# Patient Record
Sex: Male | Born: 1985 | Race: White | Hispanic: No | Marital: Single | Smoking: Current every day smoker
Health system: Southern US, Community
[De-identification: ages and names within clinical notes are randomized; demographics above are authoritative.]

## PROBLEM LIST (undated history)

## (undated) DIAGNOSIS — F909 Attention-deficit hyperactivity disorder, unspecified type: Secondary | ICD-10-CM

---
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
## 2007-07-02 ENCOUNTER — Emergency Department (HOSPITAL_COMMUNITY): Admission: EM | Admit: 2007-07-02 | Discharge: 2007-07-02 | Payer: Self-pay | Admitting: Emergency Medicine

## 2007-07-29 ENCOUNTER — Emergency Department (HOSPITAL_COMMUNITY): Admission: EM | Admit: 2007-07-29 | Discharge: 2007-07-29 | Payer: Self-pay | Admitting: Emergency Medicine

## 2007-08-05 ENCOUNTER — Emergency Department (HOSPITAL_COMMUNITY): Admission: EM | Admit: 2007-08-05 | Discharge: 2007-08-05 | Payer: Self-pay | Admitting: Emergency Medicine

## 2016-04-21 ENCOUNTER — Ambulatory Visit (HOSPITAL_COMMUNITY)
Admission: EM | Admit: 2016-04-21 | Discharge: 2016-04-21 | Disposition: A | Discharge status: Home / Self Care | Payer: BLUE CROSS/BLUE SHIELD | Attending: Emergency Medicine | Admitting: Emergency Medicine

## 2016-04-21 ENCOUNTER — Encounter (HOSPITAL_COMMUNITY): Payer: Self-pay

## 2016-04-21 DIAGNOSIS — K0889 Other specified disorders of teeth and supporting structures: Secondary | ICD-10-CM

## 2016-04-21 HISTORY — DX: Attention-deficit hyperactivity disorder, unspecified type: F90.9

## 2016-04-21 MED ORDER — HYDROCODONE-ACETAMINOPHEN 5-325 MG PO TABS: 1.0000 | ORAL_TABLET | ORAL | 0 refills | Status: DC | PRN

## 2016-04-21 NOTE — ED Triage Notes (Signed)
Pt said he is having his wisdom tooth removed in a few weeks but unable to bear the pain tonight so came to see us. No fever, ear pain or facial swelling

## 2016-04-21 NOTE — Discharge Instructions (Signed)
Must follow-up with your dentist or obtain a primary care provider for additional pain medicine. If your tooth gets worse call your dentist as soon as possible. Make sure you are taking her antibiotics.

## 2016-04-21 NOTE — ED Provider Notes (Signed)
CSN: [id number]     Arrival date & time 04/21/16  1855 History   First MD Initiated Contact with Patient 04/21/16 2023     Chief Complaint  Patient presents with  . Dental Pain   (Consider location/radiation/quality/duration/timing/severity/associated sxs/prior Treatment) 30 year old male complaining of left upper third molar pain. States he saw a dentist 3 weeks ago and told them they needed to have them extracted. States he was prescribed antibiotics for which she continues to take and pain medicine. States he has not seen anyone for his tooth or analgesics since that time. Patient states that he has an appointment on November 20 for tooth extraction. Dr. Caryn SectionMark Wilkerson  [facility] reports that he had a prescription for Percocet 5 mg #15 filled 4 days ago. The patient originally denied that then stated that he forgot when confronted with the reporting system report.      Past Medical History:  Diagnosis Date  . ADHD 1995   History reviewed. No pertinent surgical history. History reviewed. No pertinent family history. Social History  Substance Use Topics  . Smoking status: Current Every Day Smoker    Packs/day: 1.00    Years: 8.00    Types: Cigarettes  . Smokeless tobacco: Never Used  . Alcohol use No    Review of Systems  Constitutional: Negative.   HENT: Positive for dental problem. Negative for congestion.   Eyes: Negative.   Respiratory: Negative.   Neurological: Negative.   All other systems reviewed and are negative.   Allergies  Patient has no known allergies.  Home Medications   Prior to Admission medications   Medication Sig Start Date End Date Taking? Authorizing Provider  HYDROcodone-acetaminophen (NORCO/VICODIN) 5-325 MG tablet Take 1 tablet by mouth every 4 (four) hours as needed. 04/21/16   Hayden Rasmussenavid Kristiana Jacko, NP   Meds Ordered and Administered this Visit  Medications - No data to display  BP 110/69 (BP Location: Left Arm)   Pulse 62   Temp 98.3 F  (36.8 C) (Oral)   Resp 16   SpO2 99%  No data found.   Physical Exam  Constitutional: He is oriented to person, place, and time. He appears well-developed and well-nourished. No distress.  HENT:  Head: Normocephalic and atraumatic.  Mouth/Throat: Oropharynx is clear and moist.  Left second and third molars intact but with mild surrounding gingival swelling. Positive for dental tenderness. No abscess formation seen.  Neck: Normal range of motion. Neck supple.  Cardiovascular: Normal rate.   Pulmonary/Chest: Effort normal.  Neurological: He is alert and oriented to person, place, and time.  Skin: Skin is warm and dry.  Nursing note and vitals reviewed.   Urgent Care Course   Clinical Course     Procedures (including critical care time)  Labs Review Labs Reviewed - No data to display  Imaging Review No results found.   Visual Acuity Review  Right Eye Distance:   Left Eye Distance:   Bilateral Distance:    Right Eye Near:   Left Eye Near:    Bilateral Near:         MDM   1. Pain, dental    Must follow-up with your dentist or obtain a primary care provider for additional pain medicine. If your tooth gets worse call your dentist as soon as possible. Make sure you are taking her antibiotics. Meds ordered this encounter  Medications  . HYDROcodone-acetaminophen (NORCO/VICODIN) 5-325 MG tablet    Sig: Take 1 tablet by mouth every 4 (  four) hours as needed.    Dispense:  12 tablet    Refill:  0    Order Specific Question:   Supervising Provider    Answer:   Micheline ChapmanHONIG, ERIN J [[id number]]       Hayden Rasmussenavid Sausha Raymond, NP 04/21/16 2038

## 2017-02-14 ENCOUNTER — Encounter (HOSPITAL_COMMUNITY): Payer: Self-pay

## 2017-02-14 ENCOUNTER — Emergency Department (HOSPITAL_COMMUNITY): Payer: BLUE CROSS/BLUE SHIELD

## 2017-02-14 ENCOUNTER — Observation Stay (HOSPITAL_COMMUNITY)
Admission: EM | Admit: 2017-02-14 | Discharge: 2017-02-15 | DRG: 316 | Discharge status: Against Medical Advice | Payer: BLUE CROSS/BLUE SHIELD | Attending: Cardiology | Admitting: Cardiology

## 2017-02-14 DIAGNOSIS — I214 Non-ST elevation (NSTEMI) myocardial infarction: Secondary | ICD-10-CM

## 2017-02-14 DIAGNOSIS — R079 Chest pain, unspecified: Secondary | ICD-10-CM

## 2017-02-14 DIAGNOSIS — I319 Disease of pericardium, unspecified: Secondary | ICD-10-CM | POA: Diagnosis not present

## 2017-02-14 DIAGNOSIS — F17210 Nicotine dependence, cigarettes, uncomplicated: Secondary | ICD-10-CM | POA: Diagnosis present

## 2017-02-14 LAB — I-STAT TROPONIN, ED: TROPONIN I, POC: 9.43 ng/mL — AB (ref 0.00–0.08)

## 2017-02-14 LAB — BASIC METABOLIC PANEL
ANION GAP: 8 (ref 5–15)
BUN: 13 mg/dL (ref 6–20)
CALCIUM: 8.4 mg/dL — AB (ref 8.9–10.3)
CO2: 23 mmol/L (ref 22–32)
Chloride: 108 mmol/L (ref 101–111)
Creatinine, Ser: 0.87 mg/dL (ref 0.61–1.24)
GLUCOSE: 126 mg/dL — AB (ref 65–99)
POTASSIUM: 3.4 mmol/L — AB (ref 3.5–5.1)
SODIUM: 139 mmol/L (ref 135–145)

## 2017-02-14 LAB — APTT: APTT: 40 s — AB (ref 24–36)

## 2017-02-14 LAB — CBC
HEMATOCRIT: 44.3 % (ref 39.0–52.0)
HEMOGLOBIN: 15.8 g/dL (ref 13.0–17.0)
MCH: 29.8 pg (ref 26.0–34.0)
MCHC: 35.7 g/dL (ref 30.0–36.0)
MCV: 83.6 fL (ref 78.0–100.0)
Platelets: 123 10*3/uL — ABNORMAL LOW (ref 150–400)
RBC: 5.3 MIL/uL (ref 4.22–5.81)
RDW: 13 % (ref 11.5–15.5)
WBC: 10.1 10*3/uL (ref 4.0–10.5)

## 2017-02-14 LAB — PROTIME-INR
INR: 0.93
Prothrombin Time: 12.4 seconds (ref 11.4–15.2)

## 2017-02-14 LAB — TROPONIN I
TROPONIN I: 6.36 ng/mL — AB (ref <0.03)
Troponin I: 8.89 ng/mL (ref <0.03)

## 2017-02-14 LAB — HEPARIN LEVEL (UNFRACTIONATED): Heparin Unfractionated: <0.1 IU/mL — ABNORMAL LOW (ref 0.30–0.70)

## 2017-02-14 MED ORDER — COLCHICINE 0.6 MG PO TABS
0.6000 mg | ORAL_TABLET | Freq: Two times a day (BID) | ORAL | Status: DC
  Administered 2017-02-14: 0.6 mg via ORAL
  Filled 2017-02-14: qty 1

## 2017-02-14 MED ORDER — HEPARIN (PORCINE) IN NACL 100-0.45 UNIT/ML-% IJ SOLN
1400.0000 [IU]/h | INTRAMUSCULAR | Status: DC
  Administered 2017-02-14: 1400 [IU]/h via INTRAVENOUS
  Filled 2017-02-14: qty 250

## 2017-02-14 MED ORDER — HEPARIN BOLUS VIA INFUSION
4000.0000 [IU] | Freq: Once | INTRAVENOUS | Status: AC
  Administered 2017-02-14: 4000 [IU] via INTRAVENOUS
  Filled 2017-02-14: qty 4000

## 2017-02-14 MED ORDER — SODIUM CHLORIDE 0.9 % IV SOLN: 250.0000 mL | INTRAVENOUS | Status: DC | PRN

## 2017-02-14 MED ORDER — ASPIRIN 325 MG PO TABS
325.0000 mg | ORAL_TABLET | Freq: Once | ORAL | Status: AC
  Administered 2017-02-14: 325 mg via ORAL
  Filled 2017-02-14: qty 1

## 2017-02-14 MED ORDER — SODIUM CHLORIDE 0.9% FLUSH
3.0000 mL | Freq: Two times a day (BID) | INTRAVENOUS | Status: DC
  Administered 2017-02-14: 3 mL via INTRAVENOUS

## 2017-02-14 MED ORDER — SODIUM CHLORIDE 0.9% FLUSH: 3.0000 mL | INTRAVENOUS | Status: DC | PRN

## 2017-02-14 MED ORDER — IBUPROFEN 200 MG PO TABS
800.0000 mg | ORAL_TABLET | Freq: Three times a day (TID) | ORAL | Status: DC
  Administered 2017-02-14: 800 mg via ORAL
  Filled 2017-02-14: qty 4

## 2017-02-14 MED ORDER — ACETAMINOPHEN 325 MG PO TABS: 650.0000 mg | ORAL_TABLET | ORAL | Status: DC | PRN

## 2017-02-14 MED ORDER — PNEUMOCOCCAL VAC POLYVALENT 25 MCG/0.5ML IJ INJ: 0.5000 mL | INJECTION | INTRAMUSCULAR | Status: DC

## 2017-02-14 MED ORDER — ONDANSETRON HCL 4 MG/2ML IJ SOLN: 4.0000 mg | Freq: Four times a day (QID) | INTRAMUSCULAR | Status: DC | PRN

## 2017-02-14 NOTE — ED Notes (Signed)
Report given to [facility], and [facility] contacted, and stated that pt may be txp after shift change.

## 2017-02-14 NOTE — ED Provider Notes (Signed)
[facility]-EMERGENCY DEPT Provider Note   CSN: [id number] Arrival date & time: 02/14/17  1209     History   Chief Complaint Chief Complaint  Patient presents with  . Chest Pain    HPI Wayne Ballard is a 31 y.o. male.  HPI Patient presents the emergency department complaining of severe chest tightness and pressure today with associated nausea.  He had intermittent chest pain yesterday as well with some radiation towards left arm.  No prior history of cardiac disease.  He does smoke cigarettes.  No family history of early cardiac disease.  He did witness a friend become unresponsive for nights ago and the friend required CPR and mouth-to-mouth resuscitation which was performed by the patient.  The patient's friend is now recovering at an outside hospital.  The day after that event he stated he felt very tired and almost flu like.  He's never had discomfort in his chest or pain like this before.  He reports on arrival to emergency department he was having ongoing chest pressure but reports it is resolved at this point.  He was originally seen at the urgent care and was sent to the ER for further evaluation.   Past Medical History:  Diagnosis Date  . ADHD 1995    Patient Active Problem List   Diagnosis Date Noted  . NSTEMI (non-ST elevated myocardial infarction) (HCC) 02/14/2017    History reviewed. No pertinent surgical history.     Home Medications    Prior to Admission medications   Not on File    Family History No family history on file.  Social History Social History  Substance Use Topics  . Smoking status: Current Every Day Smoker    Packs/day: 1.00    Years: 8.00    Types: Cigarettes  . Smokeless tobacco: Never Used  . Alcohol use No     Allergies   Patient has no known allergies.   Review of Systems Review of Systems  All other systems reviewed and are negative.    Physical Exam Updated Vital Signs BP 117/81   Pulse 73   Temp 98.1 F (36.7 C)  (Oral)   Resp (!) 21   Ht 6' (1.829 m)   Wt 104.3 kg (230 lb)   SpO2 99%   BMI 31.19 kg/m   Physical Exam  Constitutional: He is oriented to person, place, and time. He appears well-developed and well-nourished.  HENT:  Head: Normocephalic and atraumatic.  Eyes: EOM are normal.  Neck: Normal range of motion.  Cardiovascular: Normal rate, regular rhythm, normal heart sounds and intact distal pulses.   Pulmonary/Chest: Effort normal and breath sounds normal. No respiratory distress.  Abdominal: Soft. He exhibits no distension. There is no tenderness.  Musculoskeletal: Normal range of motion.  Neurological: He is alert and oriented to person, place, and time.  Skin: Skin is warm and dry.  Psychiatric: He has a normal mood and affect. Judgment normal.  Nursing note and vitals reviewed.    ED Treatments / Results  Labs (all labs ordered are listed, but only abnormal results are displayed) Labs Reviewed  BASIC METABOLIC PANEL - Abnormal; Notable for the following:       Result Value   Potassium 3.4 (*)    Glucose, Bld 126 (*)    Calcium 8.4 (*)    All other components within normal limits  CBC - Abnormal; Notable for the following:    Platelets 123 (*)    All other components within normal limits  TROPONIN I - Abnormal; Notable for the following:    Troponin I 8.89 (*)    All other components within normal limits  RAPID URINE DRUG SCREEN, HOSP PERFORMED  APTT  PROTIME-INR  I-STAT TROPONIN, ED    EKG  EKG Interpretation  Date/Time:  Friday February 14 2017 12:25:57 EDT Ventricular Rate:  61 PR Interval:    QRS Duration: 87 QT Interval:  411 QTC Calculation: 414 R Axis:   45 Text Interpretation:  Sinus rhythm Borderline short PR interval ST elevation suggests acute pericarditis No old tracing to compare Confirmed by Azalia Bilis ([id number]) on 02/14/2017 1:38:00 PM Also confirmed by Azalia Bilis ([id number]), editor Madalyn Rob [phone])  on 02/14/2017 2:03:26 PM        Radiology Dg Chest 2 View  Result Date: 02/14/2017 CLINICAL DATA:  Chest pain, cough, and weakness for 2 days. EXAM: CHEST  2 VIEW COMPARISON:  None. FINDINGS: The heart size and mediastinal contours are within normal limits. Both lungs are clear. The visualized skeletal structures are unremarkable. IMPRESSION: Negative.  No active cardiopulmonary disease. Electronically Signed   By: Myles Rosenthal M.D.   On: 02/14/2017 13:07    ++++++++++++++++++++++++++++++++++++++++++++  Procedures .Critical Care Performed by: Azalia Bilis Authorized by: Azalia Bilis     Total critical care time: 33 minutes Critical care time was exclusive of separately billable procedures and treating other patients. Critical care was necessary to treat or prevent imminent or life-threatening deterioration. Critical care was time spent personally by me on the following activities: development of treatment plan with patient and/or surrogate as well as nursing, discussions with consultants, evaluation of patient's response to treatment, examination of patient, obtaining history from patient or surrogate, ordering and performing treatments and interventions, ordering and review of laboratory studies, ordering and review of radiographic studies, pulse oximetry and re-evaluation of patient's condition.  +++++++++++++++++++++++++++++++++++++++++++++++   Medications Ordered in ED Medications  heparin ADULT infusion 100 units/mL (25000 units/287mL sodium chloride 0.45%) (not administered)  heparin bolus via infusion 4,000 Units (not administered)  aspirin tablet 325 mg (325 mg Oral Given 02/14/17 1507)     Initial Impression / Assessment and Plan / ED Course  I have reviewed the triage vital signs and the nursing notes.  Pertinent labs & imaging results that were available during my care of the patient were reviewed by me and considered in my medical decision making (see chart for details).     Patient with  intermittent chest tightness and pressure concerning for acute coronary syndrome.  Elevated troponin here makes him non-ST elevation MI.  Patient be given aspirin and heparin at this time.  Given his recent stressful event four nights ago this may represent [place] Cardiomyopathy.  Possible represent viral myocarditis as well.  Lower suspicion for coronary occlusion however patient be started on heparin in the meantime.  Call placed to cardiology and cardiology agrees to admit the patient to the [facility] stepdown unit.  Patient is pain-free at this time    Final Clinical Impressions(s) / ED Diagnoses   Final diagnoses:  NSTEMI (non-ST elevated myocardial infarction) [facility])    New Prescriptions New Prescriptions   No medications on file     Azalia Bilis, MD 02/14/17 1538

## 2017-02-14 NOTE — ED Notes (Signed)
RN made aware of trop result.

## 2017-02-14 NOTE — ED Notes (Signed)
[facility] here to transport pt to [facility]. 

## 2017-02-14 NOTE — H&P (Addendum)
Cardiology Admission History and Physical:   Patient ID: Wayne Ballard; MRN: [id number]; DOB: 1986/05/30   Admission date: 02/14/2017  Primary Care Provider: Patient, No Pcp Per Primary Cardiologist: none Primary Electrophysiologist:  none  Chief Complaint:  Chest pain  Patient Profile:   Wayne Ballard is a 31 y.o. male with no past medical history who presented to the ER via EMS with 2 days of midsternal chest pain, found to have an elevated troponin.  History of Present Illness:   Wayne Ballard reports that his chest pain initially started on Thursday morning but resolved on its own, he went to work without recurrance.  This morning (Friday) the chest pain recurred and was much greater in severity.  It was associated with dizziness and nausea.  He was take to to the ER where his chest pain resolved spontaneously and has not resumed.    He reports that on Monday of this week, his friend OD'd on heroin and he gave him CPR including mouth to mouth.  He thinks he may have got vomit in his mouth and has had flu like symptoms all week (fatigue, chills and diarrhea).  He currently denies CP, SOB, nausea, orthopnea, or edema.   Past Medical History:  Diagnosis Date  . ADHD 1995    History reviewed. No pertinent surgical history.   Medications Prior to Admission: Prior to Admission medications   Not on File     Allergies:   No Known Allergies  Social History:   Social History   Social History  . Marital status: Single    Spouse name: N/A  . Number of children: N/A  . Years of education: N/A   Occupational History  . Not on file.   Social History Main Topics  . Smoking status: Current Every Day Smoker    Packs/day: 1.00    Years: 8.00    Types: Cigarettes  . Smokeless tobacco: Never Used  . Alcohol use Yes     Comment: Social  . Drug use: Yes    Types: Marijuana     Comment: Used heroin approx 5 months ago, distant cocaine use (years), no methamphetamine  .  Sexual activity: Not on file   Other Topics Concern  . Not on file   Social History Narrative  . No narrative on file    Family History:   No significant CAD or SCD h istory.  ROS:  Please see the history of present illness. Patient endorsed 1 week of flu like illness, diarrhea and fatigue.  All other ROS reviewed and negative.     Physical Exam/Data:   Vitals:   02/14/17 1900 02/14/17 1930 02/14/17 2015 02/14/17 2035  BP: 118/70 121/75  121/72  Pulse: (!) 59 66  71  Resp: (!) 21 12  19   Temp:    98.6 F (37 C)  TempSrc:    Oral  SpO2: 99% 99%  99%  Weight:   97 kg (213 lb 14.4 oz)   Height:   6' (1.829 m)    No intake or output data in the 24 hours ending 02/14/17 2053 Filed Weights   02/14/17 1224 02/14/17 2015  Weight: 104.3 kg (230 lb) 97 kg (213 lb 14.4 oz)   Body mass index is 29.01 kg/m.  General:  Well nourished, well developed, in no acute distress HEENT: normal Lymph: no adenopathy Neck: no JVD Endocrine:  No thryomegaly Vascular: No carotid bruits; FA pulses 2+ bilaterally without bruits  Cardiac:  normal  S1, S2; RRR; no murmur, no rub Lungs:  clear to auscultation bilaterally, no wheezing, rhonchi or rales  Abd: soft, nontender, no hepatomegaly  Ext: no edema Musculoskeletal:  No deformities, BUE and BLE strength normal and equal Skin: warm and dry  Neuro:  CNs 2-12 intact, no focal abnormalities noted Psych:  Normal affect    EKG:  The ECG that was done  was personally reviewed and demonstrates diffuse mild ST segment elevation with PR depression consistent with pericarditis  Relevant CV Studies: Bedside echo demonstrated no pericardial effusion and grossly normal LV systolic function  Laboratory Data:  Chemistry  Recent Labs Lab 02/14/17 1249  NA 139  K 3.4*  CL 108  CO2 23  GLUCOSE 126*  BUN 13  CREATININE 0.87  CALCIUM 8.4*  GFRNONAA >60  GFRAA >60  ANIONGAP 8    No results for input(s): PROT, ALBUMIN, AST, ALT, ALKPHOS,  BILITOT in the last 168 hours. Hematology  Recent Labs Lab 02/14/17 1249  WBC 10.1  RBC 5.30  HGB 15.8  HCT 44.3  MCV 83.6  MCH 29.8  MCHC 35.7  RDW 13.0  PLT 123*   Cardiac Enzymes  Recent Labs Lab 02/14/17 1310  TROPONINI 8.89*     Recent Labs Lab 02/14/17 1319  TROPIPOC 9.43*    BNPNo results for input(s): BNP, PROBNP in the last 168 hours.  DDimer No results for input(s): DDIMER in the last 168 hours.  Radiology/Studies:  Dg Chest 2 View  Result Date: 02/14/2017 CLINICAL DATA:  Chest pain, cough, and weakness for 2 days. EXAM: CHEST  2 VIEW COMPARISON:  None. FINDINGS: The heart size and mediastinal contours are within normal limits. Both lungs are clear. The visualized skeletal structures are unremarkable. IMPRESSION: Negative.  No active cardiopulmonary disease. Electronically Signed   By: Myles Rosenthal M.D.   On: 02/14/2017 13:07    Assessment and Plan:   1. Chest pain: Patient presents with 2 days of intermittent chest pain with ECG findings consistent with percarditis.  Elevated troponin indicates that there has been myocardial injury likely related to myo-pericarditis.  Patient is currently chest pain free and is low risk for ACS given young age and no prior cardiac history.  No signs of heart failure on exam and LV systolic function grossly normal on bedside echo.  Will evaluate further with formal TTE tomorrow to better assess cardiac function and trend troponins overnight.  Given recent bodily fluid exposure from friend who OD'd, will check HIV and hepatitis panel.  Will treat presumptively for pericarditis with ibuprofen and colchicine 1. TTE in am 2. Trend troponins 3. Check HIV and hepatitis panel 4. Ibuprofen 800 TID 5. Colchicine 0.6 BID  Severity of Illness: The appropriate patient status for this patient is INPATIENT. Inpatient status is judged to be reasonable and necessary in order to provide the required intensity of service to ensure the patient's  safety. The patient's presenting symptoms, physical exam findings, and initial radiographic and laboratory data in the context of their chronic comorbidities is felt to place them at high risk for further clinical deterioration. Furthermore, it is not anticipated that the patient will be medically stable for discharge from the hospital within 2 midnights of admission. The following factors support the patient status of inpatient.   " The patient's presenting symptoms include chest pain with nausea and vomittign. " The worrisome physical exam findings include none. " The initial radiographic and laboratory data are worrisome because of elevated troponin. " The chronic  co-morbidities include none.   * I certify that at the point of admission it is my clinical judgment that the patient will require inpatient hospital care spanning beyond 2 midnights from the point of admission due to high intensity of service, high risk for further deterioration and high frequency of surveillance required.*    Signed, Oliva BustardZak A Loring, MD  02/14/2017 8:53 PM   Addednum: Pt left AMA at 5:00 am before being able to be evaluated by a provider.  Told his nurse that he wanted to go smoke and left. Oliva BustardZak A Loring, MD  5:00 AM  I personally examined and interviewed patient with Dr. Kathrine CordsLoring and agree with assessment and plan as stated above. I also performed bedside echo showing normal LV function.   Arvilla MeresBensimhon, Daniel, MD  9:12 PM

## 2017-02-14 NOTE — ED Triage Notes (Signed)
Pt brought in by EMS  From [facility]. Pt is c/o mid-sternal  chest pain and anxiety that is described as pressure, tightness, with associated dizziness and nausea with x 2 episodes of emesis . Pt was given 4 mg of Zofran and was effective in nausea management.  Per EMS Pt friend passed away yesterday and pt was present /and or performing CPR and this has caused the onset of symptoms.   bp 140/75, HR 60, RR 18, O2 sat 100% RA , [facility]

## 2017-02-14 NOTE — ED Notes (Signed)
Patient transported to X-ray 

## 2017-02-14 NOTE — ED Notes (Signed)
RN made aware of trop result.  

## 2017-02-14 NOTE — Progress Notes (Signed)
ANTICOAGULATION CONSULT NOTE - Initial Consult  Pharmacy Consult for heparin Indication: ACS/STEMI  No Known Allergies  Patient Measurements: Height: 6' (182.9 cm) Weight: 230 lb (104.3 kg) IBW/kg (Calculated) : 77.6 Heparin Dosing Weight: 99.2 kg  Vital Signs: Temp: 98.1 F (36.7 C) (08/31 1224) Temp Source: Oral (08/31 1224) BP: 117/81 (08/31 1430) Pulse Rate: 73 (08/31 1430)  Labs:  Recent Labs  02/14/17 1249 02/14/17 1310  HGB 15.8  --   HCT 44.3  --   PLT 123*  --   CREATININE 0.87  --   TROPONINI  --  8.89*    Estimated Creatinine Clearance: 156.5 mL/min (by C-G formula based on SCr of 0.87 mg/dL).   Medical History: Past Medical History:  Diagnosis Date  . ADHD 1995    Medications:  No meds PTA  Assessment: 31 yo M with CP.  Pharmacy consulted to dose heparin for ACS/STEMI.  Wt 104.3 kg, HDW 99.2 kg, Cr WNL, Hg 15.8, platelet count is low at 129.  Troponin + at 8.89.   Goal of Therapy:  Heparin level 0.3-0.7 units/ml Monitor platelets by anticoagulation protocol: Yes   Plan:  Give 4000 units bolus x 1 Start heparin infusion at 1400 units/hr Check anti-Xa level in 6 hours and daily while on heparin Continue to monitor H&H and platelets   Herby AbrahamMichelle T. Vikram Tillett, Pharm.D. [phone] 02/14/2017 3:22 PM

## 2017-02-15 ENCOUNTER — Other Ambulatory Visit (HOSPITAL_COMMUNITY): Payer: BLUE CROSS/BLUE SHIELD

## 2017-02-15 LAB — LIPID PANEL
Cholesterol: 119 mg/dL (ref 0–200)
HDL: 26 mg/dL — ABNORMAL LOW (ref >40)
LDL Cholesterol: 74 mg/dL (ref 0–99)
Total CHOL/HDL Ratio: 4.6 RATIO
Triglycerides: 95 mg/dL (ref <150)
VLDL: 19 mg/dL (ref 0–40)

## 2017-02-15 LAB — HIV ANTIBODY (ROUTINE TESTING W REFLEX): HIV Screen 4th Generation wRfx: NONREACTIVE

## 2017-02-15 LAB — MRSA PCR SCREENING: MRSA BY PCR: NEGATIVE

## 2017-02-15 LAB — TROPONIN I: TROPONIN I: 5.9 ng/mL — AB (ref <0.03)

## 2017-02-15 NOTE — Progress Notes (Signed)
Patient just left AMA. Patient stated he could not stay in the hospital until the morning and that he will come back later today. Patient declined to speak with the cardiologist before leaving. Leaving hospital AMA form signed and in chart. MD notified.

## 2017-02-17 LAB — HEPATITIS B SURFACE ANTIGEN: Hepatitis B Surface Ag: NEGATIVE

## 2017-02-19 ENCOUNTER — Ambulatory Visit (INDEPENDENT_AMBULATORY_CARE_PROVIDER_SITE_OTHER): Payer: BLUE CROSS/BLUE SHIELD | Admitting: Urgent Care

## 2017-02-19 ENCOUNTER — Encounter: Payer: Self-pay | Admitting: Urgent Care

## 2017-02-19 VITALS — BP 126/75 | HR 76 | Temp 98.2°F | Resp 18 | Ht 71.5 in | Wt 216.4 lb

## 2017-02-19 DIAGNOSIS — R0982 Postnasal drip: Secondary | ICD-10-CM

## 2017-02-19 DIAGNOSIS — R05 Cough: Secondary | ICD-10-CM

## 2017-02-19 DIAGNOSIS — I319 Disease of pericardium, unspecified: Secondary | ICD-10-CM

## 2017-02-19 DIAGNOSIS — R059 Cough, unspecified: Secondary | ICD-10-CM

## 2017-02-19 DIAGNOSIS — R0789 Other chest pain: Secondary | ICD-10-CM | POA: Diagnosis not present

## 2017-02-19 DIAGNOSIS — F17200 Nicotine dependence, unspecified, uncomplicated: Secondary | ICD-10-CM | POA: Diagnosis not present

## 2017-02-19 DIAGNOSIS — J029 Acute pharyngitis, unspecified: Secondary | ICD-10-CM

## 2017-02-19 MED ORDER — CETIRIZINE HCL 10 MG PO TABS: 10.0000 mg | ORAL_TABLET | Freq: Every day | ORAL | 11 refills | Status: DC

## 2017-02-19 MED ORDER — COLCHICINE 0.6 MG PO TABS: 0.6000 mg | ORAL_TABLET | Freq: Two times a day (BID) | ORAL | 2 refills | Status: DC

## 2017-02-19 MED ORDER — OMEPRAZOLE 20 MG PO CPDR: 20.0000 mg | DELAYED_RELEASE_CAPSULE | Freq: Every day | ORAL | 2 refills | Status: DC

## 2017-02-19 MED ORDER — BENZONATATE 100 MG PO CAPS: 100.0000 mg | ORAL_CAPSULE | Freq: Three times a day (TID) | ORAL | 0 refills | Status: DC | PRN

## 2017-02-19 NOTE — Patient Instructions (Addendum)
Pericarditis Pericarditis is swelling and irritation (inflammation) of your pericardium. The pericardium is a thin, double-layered, fluid-filled sac that surrounds your heart. The pericardium protects and holds your heart in your chest cavity. Inflammation of your pericardium can cause rubbing (friction) between the two layers when your heart beats. Fluid may build up between the layers of the sac (pericardial effusion). Different types of pericarditis include:  Acute pericarditis. Inflammation develops suddenly and causes pericardial effusion.  Chronic pericarditis. Inflammation may develop gradually, or it may continue after acute pericarditis and last longer than 6 months.  Constrictive pericarditis. The layers of the pericardium stiffen and develop scar tissue. The scar tissue thickens and sticks together. This makes it difficult for the heart to pump and to work as it normally does. This type is rare.  In most cases, pericarditis is acute and not serious. Chronic pericarditis and constrictive pericarditis may be more serious and may require treatment. What are the causes? Often, the cause of pericarditis is not known.If a cause is found, the cause may be:  A viral infection.  A heart attack (myocardial infarction).  Open-heart surgery (coronary artery bypass graft surgery).  Chest injury.  Autoimmune conditions, such as lupus or rheumatoid arthritis.  Kidney failure.  Low-functioning thyroid gland (hypothyroidism).  Cancer from another part of the body that has spread (metastasized) to the pericardium.  Radiation treatment.  Certain medicines, including some seizure medicines, blood thinners, heart medicines, and antibiotics.  A bacterial or fungal infection. This cause is less common.  What increases the risk? The following factors may increase your risk of pericarditis:  Being male.  Being 20-50 years old.  Having had pericarditis before.  Having had a recent  upper respiratory tract infection.  What are the signs or symptoms? The most common symptom of pericarditis is chest pain. This pain may:  Be in the center of your chest or the left side of your chest.  Not go away with rest.  Last for many hours or days.  Worsen when you lie down and go away when you sit up and lean forward.  Worsen when you swallow.  Move to your back, neck, or shoulder.  Other symptoms may include:  A chronic, dry cough.  Heart palpitations. These may feel like rapid, fluttering, or pounding heartbeats.  Dizziness or fainting.  Tiredness or fatigue.  Fever.  Rapid breathing.  Shortness of breath when lying down.  How is this diagnosed? This condition is diagnosed with a medical history, physical exam, and diagnostic tests. During your physical exam, your health care provider will listen for friction while your heart beats (pericardial rub). You may also have tests, including:  Blood work to look for signs of infection and inflammation.  Electrocardiogram (ECG).  Echocardiogram.  CT scan.  MRI.  Culture of pericardial fluid.  A tissue sample (biopsy) of the pericardium.  If tests show that you may have constrictive pericarditis, you may have a procedure (cardiac catheterization) to confirm this diagnosis. How is this treated? Treatment for this condition depends on the cause and type of pericarditis. In most cases, acute pericarditis will clear up on its own within 10 days. Treatment for other types of pericarditis may include:  Medicines, such as: ? NSAIDs for pain and inflammation. ? Steroids to reduce inflammation. ? Colchicine to relieve pain and inflammation.  A procedure to remove fluid using a needle (pericardiocentesis) if pericardial effusion puts pressure on the heart.  Surgery to remove part of the pericardium if constrictive pericarditis   develops.  If another condition is causing your pericarditis, you may need treatment  for that underlying condition. Follow these instructions at home:  Do not use tobacco products, including cigarettes, chewing tobacco, or e-cigarettes. If you need help quitting, ask your health care provider.  Maintain a healthy weight.  Follow an exercise program as told by your health care provider. You may need to limit your exercise until your symptoms go away.  Eat a heart-healthy diet. A registered dietitian can help you to learn about healthy food choices.  Take over-the-counter and prescription medicines only as told by your health care provider. Keep a list of all of your medicines with you at all times. For each medicine, include information about the name, the dosage, how often you take it, and how you take it.  Keep all follow-up visits as told by your health care provider. This is important. Contact a health care provider if:  You continue to have symptoms of pericarditis.  You develop new symptoms of pericarditis.  Your symptoms get worse. Get help right away if:  You have worsening chest pain and difficulty breathing. These symptoms may represent a serious problem that is an emergency. Do not wait to see if the symptoms will go away. Get medical help right away. Call your local emergency services (911 in the [place] Do not drive yourself to the hospital. This information is not intended to replace advice given to you by your health care provider. Make sure you discuss any questions you have with your health care provider. Document Released: 11/27/2000 Document Revised: 11/06/2015 Document Reviewed: 12/14/2014 Elsevier Interactive Patient Education  2018 [employer].    Colchicine tablets or capsules What is this medicine? COLCHICINE (KOL chi seen) is for joint pain and swelling due to attacks of acute gouty arthritis. The medicine is also used to treat familial Mediterranean fever. This medicine may be used for other purposes; ask your health care provider or  pharmacist if you have questions. COMMON BRAND NAME(S): Colcrys, MITIGARE What should I tell my health care provider before I take this medicine? They need to know if you have any of these conditions: -anemia -blood disorders like leukemia or lymphoma -heart disease -immune system problems -intestinal disease -kidney disease -liver disease -muscle pain or weakness -take other medicines -stomach problems -an unusual or allergic reaction to colchicine, other medicines, lactose, foods, dyes, or preservatives -pregnant or trying to get pregnant -breast-feeding How should I use this medicine? Take this medicine by mouth with a full glass of water. Follow the directions on the prescription label. You can take it with or without food. If it upsets your stomach, take it with food. Take your medicine at regular intervals. Do not take your medicine more often than directed. A special MedGuide will be given to you by the pharmacist with each prescription and refill. Be sure to read this information carefully each time. Talk to your pediatrician regarding the use of this medicine in children. While this drug may be prescribed for children as young as 54 years old for selected conditions, precautions do apply. Patients over 79 years old may have a stronger reaction and need a smaller dose. Overdosage: If you think you have taken too much of this medicine contact a poison control center or emergency room at once. NOTE: This medicine is only for you. Do not share this medicine with others. What if I miss a dose? If you miss a dose, take it as soon as you can. If  it is almost time for your next dose, take only that dose. Do not take double or extra doses. What may interact with this medicine? Do not take this medicine with any of the following medications: -certain medicines for fungal infections like itraconazole This medicine may also interact with the following medications: -alcohol -certain  medicines for cholesterol like atorvastatin -certain medicines for coughs and colds -certain medicines to help you breathe better -cyclosporine -digoxin -epinephrine -grapefruit or grapefruit juice -methenamine -other medicines for fungal infection -sodium bicarbonate -some antibiotics like clarithromycin, erythromycin, and telithromycin -some medicines for an irregular heartbeat or other heart problems -some medicines for cancer, like lapatinib and tamoxifen -some medicines for HIV This list may not describe all possible interactions. Give your health care provider a list of all the medicines, herbs, non-prescription drugs, or dietary supplements you use. Also tell them if you smoke, drink alcohol, or use illegal drugs. Some items may interact with your medicine. What should I watch for while using this medicine? Visit your doctor or health care professional for regular checks on your progress. You may need periodic blood checks. Alcohol can increase the chance of getting stomach problems and gout attacks. Do not drink alcohol. What side effects may I notice from receiving this medicine? Side effects that you should report to your doctor or health care professional as soon as possible: -allergic reactions like skin rash, itching or hives, swelling of the face, lips, or tongue -fever, chills, or sore throat -muscle tenderness, pain, or weakness -numbness or tingling in hands or feet -unusual bleeding or bruising -unusually weak or tired -vomiting Side effects that usually do not require medical attention (report to your doctor or health care professional if they continue or are bothersome): -diarrhea -hair loss -loss of appetite -stomach pain or nausea This list may not describe all possible side effects. Call your doctor for medical advice about side effects. You may report side effects to FDA at 1-800-FDA-1088. Where should I keep my medicine? Keep out of the reach of  children. Store at room temperature between 15 and 30 degrees C (59 and 86 degrees F). Keep container tightly closed. Protect from light. Throw away any unused medicine after the expiration date. NOTE: This sheet is a summary. It may not cover all possible information. If you have questions about this medicine, talk to your doctor, pharmacist, or health care provider.  2018 Elsevier/Gold Standard (2012-11-30 16:48:38)     IF you received an x-ray today, you will receive an invoice from [facility]. Please contact [facility] at [phone] with questions or concerns regarding your invoice.   IF you received labwork today, you will receive an invoice from [facility]. Please contact [facility] at [phone] with questions or concerns regarding your invoice.   Our billing staff will not be able to assist you with questions regarding bills from these companies.  You will be contacted with the lab results as soon as they are available. The fastest way to get your results is to activate your My Chart account. Instructions are located on the last page of this paperwork. If you have not heard from us regarding the results in 2 weeks, please contact this office.

## 2017-02-19 NOTE — Progress Notes (Signed)
   MRN: [id number] DOB: 1985/08/05  Subjective:   Wayne LampMichael S Ballard is a 31 y.o. male presenting for hospital follow up regarding pericarditis. Patient was seen 02/14/2017. ECG demonstrated diffuse mild ST elevation consistent with pericarditis, was started on ibuprofen and colchicine. Patient also had elevated troponin indicating myocardial injury likely related to myo-pericarditis as per Dr. Kathrine CordsLoring. Bedside echocardiogram did not show pericardial effusion and demonstrated grossly normal LV systolic function. Unfortunately, patient left AMA. Today, patient reports that his chest pain has resolved since being in the hospital. Denies fever, shob, heart racing, n/v, abdominal pain, rashes. He has had a sore throat, sinus congestion, mildly productive cough for ~1 week. He would like to get a work note for 02/14/2017, the day he had to be hospitalized for his pericarditis.  Wayne Ballard is not currently taking any medications. Also has No Known Allergies.  Wayne Ballard  has a past medical history of ADHD (1995). Also denies past surgical history.  Objective:   Vitals: BP 126/75   Pulse 76   Temp 98.2 F (36.8 C) (Oral)   Resp 18   Ht 5' 11.5" (1.816 m)   Wt 216 lb 6.4 oz (98.2 kg)   SpO2 98%   BMI 29.76 kg/m   Physical Exam  Constitutional: He is oriented to person, place, and time. He appears well-developed and well-nourished.  Eyes: No scleral icterus.  Neck: Normal range of motion. Neck supple.  Cardiovascular: Normal rate, regular rhythm and intact distal pulses.  Exam reveals no gallop and no friction rub.   No murmur heard. Pulmonary/Chest: No respiratory distress. He has no wheezes. He has no rales.  Abdominal: Soft. Bowel sounds are normal. He exhibits no distension and no mass. There is no tenderness. There is no guarding.  Lymphadenopathy:    He has no cervical adenopathy.  Neurological: He is alert and oriented to person, place, and time.  Skin: Skin is warm and dry.   ECG  interpretation - Non-specific t-wave flattening in Leads III, V6 but NO diffuse ST-elevation. Normal sinus rhythm at 60bpm.  Assessment and Plan :   This case was precepted with Dr. Katrinka BlazingSmith.   1. Myopericarditis 2. Other chest pain - Stable, discussed case with Dr. Rosemary HolmsPatwardhan. They will work patient into [facility] for follow up and management of pericarditis. In the meantime, patient agreed to start colchicine BID. He will also use Prilosec with colchicine. - Ambulatory referral to Cardiology  3. Sore throat 4. Post-nasal drainage 5. Cough - May be allergy related or viral illness. Offered supportive care. Return-to-clinic precautions discussed, patient verbalized understanding.  - benzonatate (TESSALON) 100 MG capsule; Take 1-2 capsules (100-200 mg total) by mouth 3 (three) times daily as needed.  Dispense: 60 capsule; Refill: 0  6. Tobacco use disorder - Encouraged smoking cessation, patient plans on cutting back his smoking.  Wallis BambergMario Nashonda Limberg, PA-C [facility] [phone] 02/19/2017 10:54 AM

## 2017-02-20 LAB — ANTISTREPTOLYSIN O TITER: ASO: 98 [IU]/mL (ref 0.0–200.0)

## 2017-02-20 LAB — BASIC METABOLIC PANEL
BUN/Creatinine Ratio: 15 (ref 9–20)
BUN: 12 mg/dL (ref 6–20)
CALCIUM: 9.4 mg/dL (ref 8.7–10.2)
CO2: 26 mmol/L (ref 20–29)
CREATININE: 0.78 mg/dL (ref 0.76–1.27)
Chloride: 99 mmol/L (ref 96–106)
GFR calc Af Amer: 141 mL/min/{1.73_m2} (ref >59)
GFR calc non Af Amer: 122 mL/min/{1.73_m2} (ref >59)
Glucose: 82 mg/dL (ref 65–99)
POTASSIUM: 3.9 mmol/L (ref 3.5–5.2)
SODIUM: 141 mmol/L (ref 134–144)

## 2017-02-20 LAB — TROPONIN I: TROPONIN I: 0.04 ng/mL (ref 0.00–0.04)

## 2017-03-10 NOTE — H&P (Signed)
OFFICE VISIT NOTES COPIED TO EPIC FOR DOCUMENTATION  . History of Present Illness (Jagadeesh R.  MD; 02/21/2017 1:01 PM) Patient words: NP consult EVAL for myopericarditis, cp.  The patient is a 31 year old male who presents with chest pain. Patient here in a consultation visit for chest pain referred to me by Mario Mani, PA-C. Patient with history of ADHD, does admit to using cocaine occasionally, last use was in June 2018, history of occasional marijuana use, drinks alcohol occasionally, smokes about one pack of cigarettes a day who was evaluated in the emergency department when he presented with chest pain on 91 and 18. Initially presented to the urgent care and due to abnormal EKG was sent to the emergency room. His troponins were markedly elevated, felt that the EKG abnormalities may be related to acute myopericarditis. He was recommended observation and inpatient echocardiogram but patient walked out AGAINST MEDICAL ADVICE.  Patient describes chest pain as tightness in the middle of the chest, associated with marked diaphoresis and nausea but no vomiting. This occurred one day prior to admission in the morning when he woke up. Again next day morning on the day of admission he had much severe pain that lasted for an hour, eventually presented to the urgent care and then made to go to the emergency room.  He is now referred to us for further cardiovascular evaluation. He has not had any further chest pain. Denies any shortness of breath. No leg edema or hemoptysis. Otherwise feels well.   Problem List/Past Medical (April Harrington; 02/21/2017 8:28 AM) Laboratory examination (Z01.89)   Allergies (Jennifer Sergeant; 02/21/2017 9:11 AM) No Known Drug Allergies [02/21/2017]:  Family History (Jennifer Sergeant; 02/21/2017 9:11 AM) Mother  Living, no heart issues Father  Living, no heart issues  Social History (Jennifer Sergeant; 02/21/2017 9:12 AM) Current tobacco use  Current every  day smoker. 1ppd for 10 years Alcohol Use  Occasional alcohol use. Marital status  Single. Living Situation  Lives alone. Number of Children  0.  Past Surgical History (Jennifer Sergeant; 02/21/2017 9:12 AM) None [02/21/2017]:  Medication History (Jennifer Sergeant; 02/21/2017 9:16 AM) Cetirizine HCl (10MG Tablet, 1 Oral daily) Active. Omeprazole (20MG Capsule DR, 1 Oral daily) Active. Colchicine (0.6MG Tablet, 1 Oral two times daily) Active. Benzonatate (100MG Capsule, 1 Oral as needed) Active. Medications Reconciled (meds present)  Diagnostic Studies History (Jennifer Sergeant; 02/21/2017 9:13 AM) Chest X-ray [02/14/2017]: Normal.    Review of Systems (Jagadeesh R.  MD; 02/21/2017 12:55 PM) General Not Present- Appetite Loss and Weight Gain. Respiratory Not Present- Chronic Cough and Wakes up from Sleep Wheezing or Short of Breath. Gastrointestinal Not Present- Black, Tarry Stool and Difficulty Swallowing. Musculoskeletal Not Present- Decreased Range of Motion and Muscle Atrophy. Neurological Not Present- Attention Deficit. Psychiatric Not Present- Personality Changes and Suicidal Ideation. Endocrine Not Present- Cold Intolerance and Heat Intolerance. Hematology Not Present- Abnormal Bleeding. All other systems negative  Vitals (Jennifer Sergeant; 02/21/2017 9:22 AM) 02/21/2017 9:03 AM Weight: 218.19 lb Height: 72in Body Surface Area: 2.21 m Body Mass Index: 29.59 kg/m  Pulse: 80 (Regular)  P.OX: 98% (Room air) BP: 125/71 (Sitting, Left Arm, Standard)       Physical Exam (Jagadeesh R.  MD; 02/21/2017 9:52 AM) General Mental Status-Alert. General Appearance-Cooperative and Appears stated age. Build & Nutrition-Well built and Well nourished(overweight).  Head and Neck Thyroid Gland Characteristics - normal size and consistency and no palpable nodules.  Chest and Lung Exam Chest and lung exam reveals -quiet, even and easy   respiratory  effort with no use of accessory muscles, non-tender and on auscultation, normal breath sounds, no adventitious sounds.  Cardiovascular Cardiovascular examination reveals -normal heart sounds, regular rate and rhythm with no murmurs, carotid auscultation reveals no bruits, abdominal aorta auscultation reveals no bruits and no prominent pulsation, femoral artery auscultation bilaterally reveals normal pulses, no bruits, no thrills, normal pedal pulses bilaterally and no digital clubbing, cyanosis, edema, increased warmth or tenderness.  Abdomen Palpation/Percussion Palpation and Percussion of the abdomen reveal - Non Tender and No hepatosplenomegaly.  Neurologic Neurologic evaluation reveals -alert and oriented x 3 with no impairment of recent or remote memory. Motor-Grossly intact without any focal deficits.  Musculoskeletal Global Assessment Left Lower Extremity - no deformities, masses or tenderness, no known fractures. Right Lower Extremity - no deformities, masses or tenderness, no known fractures.  Assessment & Plan Laverda Page MD; 02/21/2017 12:59 PM) NSTEMI (non-ST elevated myocardial infarction) (I21.4) Story: EKG 02/21/2017: Normal sinus rhythm at rate of 74 bpm, normal axis, minimal ST elevation in inferior and lateral leads with T-wave inversion, convex ST segment suggests acute inferior lateral infarct.  EKG 02/14/2017: Normal sinus rhythm at the rate of 61 bpm, ST elevation in inferior and lateral lead. No reciprocal ST depression. Consider acute injury pattern, consider acute pericarditis. Current Plans Complete electrocardiogram (93000) Started Aspirin 81MG, 1 (one) Tablet daily, #30, 30 days starting 02/21/2017, Ref. x11, Mail Order #90, 90 days, Ref. x3. Abnormal EKG (R94.31) Future Plans Echocardiogram 03/04/2017: Left ventricle cavity is normal in size. Mild concentric hypertrophy of the left ventricle. Normal global wall motion. Normal diastolic filling  pattern. Calculated EF 68%. Structurally normal tricuspid valve with mild regurgitation. No evidence of pulmonary hypertension.  3/64/6803: METABOLIC PANEL, BASIC (21224) - one time 03/03/2017: CBC & PLATELETS (AUTO) (82500) - one time 03/03/2017: PT (PROTHROMBIN TIME) (37048) - one time Laboratory examination (Z01.89) Story: Labs 02/19/2017: Serum troponin normal, BMP normal, ESR titer negative,  Labs 02/19/2017: Serum troponin normal, BMP normal, ESR titer negative,  02/15/2017: T. Chol 119, triglycerides 95, HDL 26, LDL 74. Serum troponin 5.90. Highest serum troponin 8.89. CBC normal except Plt 123. HIV antibody negative. Hepatitis B surface antigen negative. Tobacco use disorder, continuous (F17.209) Story: 1ppd since age 23 years. Also has H/O Cocaine use last use May 2018 Low HDL (under 40) (E78.6) Current Plans Started Rosuvastatin Calcium 5MG, 1 (one) Tablet daily, #30, 02/21/2017, Ref. x1. Note:- Recommendations:  Patient's story of chest tightness associated with marked diaphoresis that lasted an hour one day prior to admission and again similar episode on the day of admission to the emergency room along with marked elevation serum troponin which completely normalized within 4 days of hospital evaluation suggest ACS although he is only 31 years of age. Also his EKG reveals convex ST segment elevation associated with T wave inversion do suggest ACS than pericarditis.  His cardiac risk factors include history of cocaine use, although last used in June 2018, one pack of cigarettes per day tobacco use, dyslipidemia with markedly reduced HDL. I have recommended an echocardiogram to evaluate structural abnormality, more important the he does need further coronary workup including coronary angiography versus CT angiogram of the chest. G1 clear-cut non-STEMI, with a high suspicion with abnormal EKG, I have recommended coronary angiography. Schedule for cardiac catheterization, and possible  angioplasty. We discussed regarding risks, benefits, alternatives to this including stress testing, CTA and continued medical therapy. Patient wants to proceed. Understands <1-2% risk of death, stroke, MI, urgent CABG, bleeding, infection,  renal failure but not limited to these.  I have started him on aspirin 81 mg daily, also Crestor 5 mg daily. I have reviewed patient's labs/records and updated them.  CC: Mario Mani, PA-C Addendum Note(Jagadeesh R.  MD; 03/04/2017 7:47 PM) Echocardiogram 03/04/2017: Left ventricle cavity is normal in size. Mild concentric hypertrophy of the left ventricle. Normal global wall motion. Normal diastolic filling pattern. Calculated EF 68%. Structurally normal tricuspid valve with mild regurgitation. No evidence of pulmonary hypertension.  Signed by Jagadeesh R , MD (02/21/2017 1:01 PM) 

## 2017-03-11 ENCOUNTER — Ambulatory Visit (HOSPITAL_COMMUNITY)
Admission: RE | Disposition: A | Discharge status: Home / Self Care | Payer: Self-pay | Source: Ambulatory Visit | Attending: Cardiology

## 2017-03-11 ENCOUNTER — Encounter (HOSPITAL_COMMUNITY): Payer: Self-pay | Admitting: Cardiology

## 2017-03-11 ENCOUNTER — Ambulatory Visit (HOSPITAL_COMMUNITY)
Admission: RE | Admit: 2017-03-11 | Discharge: 2017-03-11 | Disposition: A | Discharge status: Home / Self Care | Payer: BLUE CROSS/BLUE SHIELD | Source: Ambulatory Visit | Attending: Cardiology | Admitting: Cardiology

## 2017-03-11 DIAGNOSIS — I214 Non-ST elevation (NSTEMI) myocardial infarction: Secondary | ICD-10-CM | POA: Diagnosis present

## 2017-03-11 DIAGNOSIS — F17210 Nicotine dependence, cigarettes, uncomplicated: Secondary | ICD-10-CM | POA: Insufficient documentation

## 2017-03-11 DIAGNOSIS — F909 Attention-deficit hyperactivity disorder, unspecified type: Secondary | ICD-10-CM | POA: Insufficient documentation

## 2017-03-11 DIAGNOSIS — F1290 Cannabis use, unspecified, uncomplicated: Secondary | ICD-10-CM | POA: Diagnosis not present

## 2017-03-11 DIAGNOSIS — R079 Chest pain, unspecified: Secondary | ICD-10-CM | POA: Insufficient documentation

## 2017-03-11 DIAGNOSIS — R9431 Abnormal electrocardiogram [ECG] [EKG]: Secondary | ICD-10-CM | POA: Diagnosis not present

## 2017-03-11 DIAGNOSIS — I252 Old myocardial infarction: Secondary | ICD-10-CM | POA: Insufficient documentation

## 2017-03-11 HISTORY — PX: LEFT HEART CATH AND CORONARY ANGIOGRAPHY: CATH118249

## 2017-03-11 LAB — CBC
HCT: 47.2 % (ref 39.0–52.0)
Hemoglobin: 16.5 g/dL (ref 13.0–17.0)
MCH: 29.6 pg (ref 26.0–34.0)
MCHC: 35 g/dL (ref 30.0–36.0)
MCV: 84.6 fL (ref 78.0–100.0)
PLATELETS: 147 10*3/uL — AB (ref 150–400)
RBC: 5.58 MIL/uL (ref 4.22–5.81)
RDW: 13 % (ref 11.5–15.5)
WBC: 6.2 10*3/uL (ref 4.0–10.5)

## 2017-03-11 LAB — BASIC METABOLIC PANEL
Anion gap: 7 (ref 5–15)
BUN: 16 mg/dL (ref 6–20)
CO2: 24 mmol/L (ref 22–32)
CREATININE: 1.12 mg/dL (ref 0.61–1.24)
Calcium: 8.8 mg/dL — ABNORMAL LOW (ref 8.9–10.3)
Chloride: 108 mmol/L (ref 101–111)
GFR calc Af Amer: >60 mL/min (ref >60)
Glucose, Bld: 96 mg/dL (ref 65–99)
Potassium: 3.8 mmol/L (ref 3.5–5.1)
SODIUM: 139 mmol/L (ref 135–145)

## 2017-03-11 LAB — PROTIME-INR
INR: 1.04
PROTHROMBIN TIME: 13.5 s (ref 11.4–15.2)

## 2017-03-11 SURGERY — LEFT HEART CATH AND CORONARY ANGIOGRAPHY
Anesthesia: LOCAL

## 2017-03-11 MED ORDER — SODIUM CHLORIDE 0.9 % IV SOLN
INTRAVENOUS | Status: AC | PRN
  Administered 2017-03-11: 999 mL/h via INTRAVENOUS

## 2017-03-11 MED ORDER — VERAPAMIL HCL 2.5 MG/ML IV SOLN
INTRA_ARTERIAL | Status: DC | PRN
  Administered 2017-03-11: 7.5 mL via INTRA_ARTERIAL

## 2017-03-11 MED ORDER — SODIUM CHLORIDE 0.9 % WEIGHT BASED INFUSION
3.0000 mL/kg/h | INTRAVENOUS | Status: DC
  Administered 2017-03-11: 3 mL/kg/h via INTRAVENOUS

## 2017-03-11 MED ORDER — MIDAZOLAM HCL 2 MG/2ML IJ SOLN
INTRAMUSCULAR | Status: AC
  Filled 2017-03-11: qty 2

## 2017-03-11 MED ORDER — LIDOCAINE HCL (PF) 1 % IJ SOLN
INTRAMUSCULAR | Status: DC | PRN
  Administered 2017-03-11: 2 mL

## 2017-03-11 MED ORDER — ONDANSETRON HCL 4 MG/2ML IJ SOLN: 4.0000 mg | Freq: Four times a day (QID) | INTRAMUSCULAR | Status: DC | PRN

## 2017-03-11 MED ORDER — HEPARIN SODIUM (PORCINE) 1000 UNIT/ML IJ SOLN
INTRAMUSCULAR | Status: DC | PRN
  Administered 2017-03-11: 6000 [IU] via INTRAVENOUS

## 2017-03-11 MED ORDER — SODIUM CHLORIDE 0.9 % WEIGHT BASED INFUSION: 1.0000 mL/kg/h | INTRAVENOUS | Status: DC

## 2017-03-11 MED ORDER — MIDAZOLAM HCL 2 MG/2ML IJ SOLN
INTRAMUSCULAR | Status: DC | PRN
  Administered 2017-03-11 (×2): 1 mg via INTRAVENOUS

## 2017-03-11 MED ORDER — HEPARIN (PORCINE) IN NACL 2-0.9 UNIT/ML-% IJ SOLN
INTRAMUSCULAR | Status: AC
  Filled 2017-03-11: qty 1000

## 2017-03-11 MED ORDER — SODIUM CHLORIDE 0.9% FLUSH: 3.0000 mL | Freq: Two times a day (BID) | INTRAVENOUS | Status: DC

## 2017-03-11 MED ORDER — ASPIRIN 81 MG PO CHEW: 81.0000 mg | CHEWABLE_TABLET | ORAL | 3 refills | Status: DC

## 2017-03-11 MED ORDER — SODIUM CHLORIDE 0.9 % IV SOLN: 250.0000 mL | INTRAVENOUS | Status: DC | PRN

## 2017-03-11 MED ORDER — SODIUM CHLORIDE 0.9% FLUSH: 3.0000 mL | INTRAVENOUS | Status: DC | PRN

## 2017-03-11 MED ORDER — FENTANYL CITRATE (PF) 100 MCG/2ML IJ SOLN
INTRAMUSCULAR | Status: DC | PRN
  Administered 2017-03-11 (×2): 25 ug via INTRAVENOUS

## 2017-03-11 MED ORDER — ASPIRIN 81 MG PO CHEW: 81.0000 mg | CHEWABLE_TABLET | ORAL | Status: DC

## 2017-03-11 MED ORDER — VERAPAMIL HCL 2.5 MG/ML IV SOLN
INTRAVENOUS | Status: AC
  Filled 2017-03-11: qty 2

## 2017-03-11 MED ORDER — ACETAMINOPHEN 325 MG PO TABS: 650.0000 mg | ORAL_TABLET | ORAL | Status: DC | PRN

## 2017-03-11 MED ORDER — IOPAMIDOL (ISOVUE-370) INJECTION 76%
INTRAVENOUS | Status: DC | PRN
  Administered 2017-03-11: 60 mL via INTRAVENOUS

## 2017-03-11 MED ORDER — SODIUM CHLORIDE 0.9 % IV SOLN: INTRAVENOUS | Status: DC

## 2017-03-11 MED ORDER — HEPARIN (PORCINE) IN NACL 2-0.9 UNIT/ML-% IJ SOLN
INTRAMUSCULAR | Status: AC | PRN
  Administered 2017-03-11: 1000 mL

## 2017-03-11 MED ORDER — IOPAMIDOL (ISOVUE-370) INJECTION 76%
INTRAVENOUS | Status: AC
  Filled 2017-03-11: qty 100

## 2017-03-11 MED ORDER — HEPARIN SODIUM (PORCINE) 1000 UNIT/ML IJ SOLN
INTRAMUSCULAR | Status: AC
  Filled 2017-03-11: qty 1

## 2017-03-11 MED ORDER — FENTANYL CITRATE (PF) 100 MCG/2ML IJ SOLN
INTRAMUSCULAR | Status: AC
  Filled 2017-03-11: qty 2

## 2017-03-11 MED ORDER — NITROGLYCERIN 1 MG/10 ML FOR IR/CATH LAB
INTRA_ARTERIAL | Status: AC
  Filled 2017-03-11: qty 10

## 2017-03-11 MED ORDER — LIDOCAINE HCL 2 % IJ SOLN
INTRAMUSCULAR | Status: AC
  Filled 2017-03-11: qty 10

## 2017-03-11 SURGICAL SUPPLY — 11 items
CATH EXPO 5F FL3.5 (CATHETERS) ×2 IMPLANT
CATH INFINITI 5FR ANG PIGTAIL (CATHETERS) ×2 IMPLANT
CATH OPTITORQUE JACKY 4.0 5F (CATHETERS) ×2 IMPLANT
DEVICE RAD COMP TR BAND LRG (VASCULAR PRODUCTS) ×2 IMPLANT
GLIDESHEATH SLEND A-KIT 6F 20G (SHEATH) ×2 IMPLANT
GUIDEWIRE INQWIRE 1.5J.035X260 (WIRE) ×1 IMPLANT
INQWIRE 1.5J .035X260CM (WIRE) ×2
KIT HEART LEFT (KITS) ×2 IMPLANT
PACK CARDIAC CATHETERIZATION (CUSTOM PROCEDURE TRAY) ×2 IMPLANT
TRANSDUCER W/STOPCOCK (MISCELLANEOUS) ×2 IMPLANT
TUBING CIL FLEX 10 FLL-RA (TUBING) ×2 IMPLANT

## 2017-03-11 NOTE — Interval H&P Note (Signed)
History and Physical Interval Note:  03/11/2017 9:21 AM  Wayne Ballard  has presented today for surgery, with the diagnosis of abnormal EKG, CAD  The various methods of treatment have been discussed with the patient and family. After consideration of risks, benefits and other options for treatment, the patient has consented to  Procedure(s): LEFT HEART CATH AND CORONARY ANGIOGRAPHY (N/A) as a surgical intervention .  The patient's history has been reviewed, patient examined, no change in status, stable for surgery.  I have reviewed the patient's chart and labs.  Questions were answered to the patient's satisfaction.    NSTEMI/Unstable angina, stabilized patient at Intermediate Risk (TIMI Score 3-4) Link Here: [email or web address] Indication:  Revascularization by PCI or CABG of 1 or more arteries in a patient with NSTEMI or unstable angina with Stabilization after presentation Intermediate risk for clinical events  A (7) Indication: 16; Score 7     [name]

## 2017-03-11 NOTE — Discharge Instructions (Signed)

## 2017-03-12 DIAGNOSIS — I319 Disease of pericardium, unspecified: Secondary | ICD-10-CM

## 2017-03-12 MED FILL — Lidocaine HCl Local Inj 2%: INTRAMUSCULAR | Qty: 10 | Status: AC

## 2017-03-31 DIAGNOSIS — I319 Disease of pericardium, unspecified: Secondary | ICD-10-CM

## 2017-07-07 ENCOUNTER — Ambulatory Visit: Payer: BLUE CROSS/BLUE SHIELD | Admitting: Urgent Care

## 2017-07-07 ENCOUNTER — Encounter: Payer: Self-pay | Admitting: Urgent Care

## 2017-07-07 ENCOUNTER — Ambulatory Visit (INDEPENDENT_AMBULATORY_CARE_PROVIDER_SITE_OTHER): Payer: BLUE CROSS/BLUE SHIELD

## 2017-07-07 ENCOUNTER — Telehealth: Payer: Self-pay | Admitting: Urgent Care

## 2017-07-07 VITALS — BP 131/82 | HR 70 | Temp 98.4°F | Resp 18 | Ht 72.0 in | Wt 237.6 lb

## 2017-07-07 DIAGNOSIS — J069 Acute upper respiratory infection, unspecified: Secondary | ICD-10-CM

## 2017-07-07 DIAGNOSIS — I319 Disease of pericardium, unspecified: Secondary | ICD-10-CM | POA: Diagnosis not present

## 2017-07-07 DIAGNOSIS — R05 Cough: Secondary | ICD-10-CM | POA: Diagnosis not present

## 2017-07-07 DIAGNOSIS — R0789 Other chest pain: Secondary | ICD-10-CM | POA: Diagnosis not present

## 2017-07-07 DIAGNOSIS — R059 Cough, unspecified: Secondary | ICD-10-CM

## 2017-07-07 LAB — TROPONIN I: Troponin I: <0.01 ng/mL (ref 0.00–0.04)

## 2017-07-07 MED ORDER — PSEUDOEPHEDRINE HCL 60 MG PO TABS: 60.0000 mg | ORAL_TABLET | Freq: Three times a day (TID) | ORAL | 0 refills | Status: DC | PRN

## 2017-07-07 MED ORDER — BENZONATATE 100 MG PO CAPS: 100.0000 mg | ORAL_CAPSULE | Freq: Three times a day (TID) | ORAL | 0 refills | Status: DC | PRN

## 2017-07-07 MED ORDER — HYDROCODONE-HOMATROPINE 5-1.5 MG/5ML PO SYRP: 5.0000 mL | ORAL_SOLUTION | Freq: Every evening | ORAL | 0 refills | Status: DC | PRN

## 2017-07-07 NOTE — Telephone Encounter (Unsigned)
Copied from CRM [id number]. Topic: General - Other >> Jul 07, 2017  5:47 PM Raquel SarnaHayes, Teresa G wrote: Gabriel RungMarlene Rice - [facility]  [phone] opt 1  Stats:  Trponin I is Less than 0.01

## 2017-07-07 NOTE — Progress Notes (Signed)
  MRN: [id number] DOB: 1986/01/20  Subjective:   Wayne LampMichael S Ballard is a 32 y.o. male with pmh of myopericarditis presenting for 2 week history of mid-sternal chest pain, dry cough, chest tightness, shob. Also has sinus congestion, runny nose, post-nasal drainage. Has been taking his colchicine. Has a history of myopericarditis, taking colchicine for this. Admits that his symptoms are different from the chest pain he previously had during his myopericarditis. Denies fever, n/v, abdominal pain, diaphoresis, neck pain, limb pain. Smokes 1ppd.   Wayne Ballard has a current medication list which includes the following prescription(s): colchicine, omeprazole, and rosuvastatin. Also has No Known Allergies.  Wayne Ballard  has a past medical history of ADHD (1995). Also  has a past surgical history that includes LEFT HEART CATH AND CORONARY ANGIOGRAPHY (N/A, 03/11/2017).  Objective:   Vitals: BP 131/82   Pulse 70   Temp 98.4 F (36.9 C) (Oral)   Resp 18   Ht 6' (1.829 m)   Wt 237 lb 9.6 oz (107.8 kg)   SpO2 94%   BMI 32.22 kg/m   Physical Exam  Constitutional: He is oriented to person, place, and time. He appears well-developed and well-nourished.  HENT:  TM's intact bilaterally, no effusions or erythema. Nasal turbinates pink, dry, nasal passages patent. No sinus tenderness. Oropharynx clear, mucous membranes moist.   Eyes: Right eye exhibits no discharge. Left eye exhibits no discharge.  Neck: Normal range of motion. Neck supple.  Cardiovascular: Normal rate, regular rhythm and intact distal pulses. Exam reveals no gallop and no friction rub.  No murmur heard. Pulmonary/Chest: No respiratory distress. He has no wheezes. He has no rales. He exhibits no tenderness.  Lymphadenopathy:    He has no cervical adenopathy.  Neurological: He is alert and oriented to person, place, and time.  Skin: Skin is warm and dry.  Psychiatric: He has a normal mood and affect.   Dg Chest 2 View  Result Date:  07/07/2017 CLINICAL DATA:  Chest tightness, atypical chest pain EXAM: CHEST  2 VIEW COMPARISON:  02/14/2017 FINDINGS: Normal heart size, mediastinal contours, and pulmonary vascularity. Lungs clear. No pleural effusion or pneumothorax. Bones unremarkable. IMPRESSION: Normal exam. Electronically Signed   By: Ulyses SouthwardMark  Boles M.D.   On: 07/07/2017 16:25   ECG interpretation - sinus rhythm at 69.  Assessment and Plan :   Chest tightness - Plan: EKG 12-Lead, Troponin I, DG Chest 2 View  Atypical chest pain - Plan: Troponin I, DG Chest 2 View  Myopericarditis - Plan: DG Chest 2 View  Cough - Plan: DG Chest 2 View  Viral URI  Will manage with supportive care. ECG, radiology report reassuring. Troponin is negative. Return-to-clinic precautions discussed, patient verbalized understanding.   Wallis BambergMario Terese Heier, PA-C [facility] [phone] 07/07/2017  3:03 PM

## 2017-07-07 NOTE — Patient Instructions (Addendum)
Nonspecific Chest Pain Chest pain can be caused by many different conditions. There is always a chance that your pain could be related to something serious, such as a heart attack or a blood clot in your lungs. Chest pain can also be caused by conditions that are not life-threatening. If you have chest pain, it is very important to follow up with your health care provider. What are the causes? Causes of this condition include:  Heartburn.  Pneumonia or bronchitis.  Anxiety or stress.  Inflammation around your heart (pericarditis) or lung (pleuritis or pleurisy).  A blood clot in your lung.  A collapsed lung (pneumothorax). This can develop suddenly on its own (spontaneous pneumothorax) or from trauma to the chest.  Shingles infection (varicella-zoster virus).  Heart attack.  Damage to the bones, muscles, and cartilage that make up your chest wall. This can include: ? Bruised bones due to injury. ? Strained muscles or cartilage due to frequent or repeated coughing or overwork. ? Fracture to one or more ribs. ? Sore cartilage due to inflammation (costochondritis).  What increases the risk? Risk factors for this condition may include:  Activities that increase your risk for trauma or injury to your chest.  Respiratory infections or conditions that cause frequent coughing.  Medical conditions or overeating that can cause heartburn.  Heart disease or family history of heart disease.  Conditions or health behaviors that increase your risk of developing a blood clot.  Having had chicken pox (varicella zoster).  What are the signs or symptoms? Chest pain can feel like:  Burning or tingling on the surface of your chest or deep in your chest.  Crushing, pressure, aching, or squeezing pain.  Dull or sharp pain that is worse when you move, cough, or take a deep breath.  Pain that is also felt in your back, neck, shoulder, or arm, or pain that spreads to any of these  areas.  Your chest pain may come and go, or it may stay constant. How is this diagnosed? Lab tests or other studies may be needed to find the cause of your pain. Your health care provider may have you take a test called an ECG (electrocardiogram). An ECG records your heartbeat patterns at the time the test is performed. You may also have other tests, such as:  Transthoracic echocardiogram (TTE). In this test, sound waves are used to create a picture of the heart structures and to look at how blood flows through your heart.  Transesophageal echocardiogram (TEE).This is a more advanced imaging test that takes images from inside your body. It allows your health care provider to see your heart in finer detail.  Cardiac monitoring. This allows your health care provider to monitor your heart rate and rhythm in real time.  Holter monitor. This is a portable device that records your heartbeat and can help to diagnose abnormal heartbeats. It allows your health care provider to track your heart activity for several days, if needed.  Stress tests. These can be done through exercise or by taking medicine that makes your heart beat more quickly.  Blood tests.  Other imaging tests.  How is this treated? Treatment depends on what is causing your chest pain. Treatment may include:  Medicines. These may include: ? Acid blockers for heartburn. ? Anti-inflammatory medicine. ? Pain medicine for inflammatory conditions. ? Antibiotic medicine, if an infection is present. ? Medicines to dissolve blood clots. ? Medicines to treat coronary artery disease (CAD).  Supportive care for conditions that   do not require medicines. This may include: ? Resting. ? Applying heat or cold packs to injured areas. ? Limiting activities until pain decreases.  Follow these instructions at home: Medicines  If you were prescribed an antibiotic, take it as told by your health care provider. Do not stop taking the  antibiotic even if you start to feel better.  Take over-the-counter and prescription medicines only as told by your health care provider. Lifestyle  Do not use any products that contain nicotine or tobacco, such as cigarettes and e-cigarettes. If you need help quitting, ask your health care provider.  Do not drink alcohol.  Make lifestyle changes as directed by your health care provider. These may include: ? Getting regular exercise. Ask your health care provider to suggest some activities that are safe for you. ? Eating a heart-healthy diet. A registered dietitian can help you to learn healthy eating options. ? Maintaining a healthy weight. ? Managing diabetes, if necessary. ? Reducing stress, such as with yoga or relaxation techniques. General instructions  Avoid any activities that bring on chest pain.  If heartburn is the cause for your chest pain, raise (elevate) the head of your bed about 6 inches (15 cm) by putting blocks under the legs. Sleeping with more pillows does not effectively relieve heartburn because it only changes the position of your head.  Keep all follow-up visits as told by your health care provider. This is important. This includes any further testing if your chest pain does not go away. Contact a health care provider if:  Your chest pain does not go away.  You have a rash with blisters on your chest.  You have a fever.  You have chills. Get help right away if:  Your chest pain is worse.  You have a cough that gets worse, or you cough up blood.  You have severe pain in your abdomen.  You have severe weakness.  You faint.  You have sudden, unexplained chest discomfort.  You have sudden, unexplained discomfort in your arms, back, neck, or jaw.  You have shortness of breath at any time.  You suddenly start to sweat, or your skin gets clammy.  You feel nauseous or you vomit.  You suddenly feel light-headed or dizzy.  Your heart begins to beat  quickly, or it feels like it is skipping beats. These symptoms may represent a serious problem that is an emergency. Do not wait to see if the symptoms will go away. Get medical help right away. Call your local emergency services (911 in the [place] Do not drive yourself to the hospital. This information is not intended to replace advice given to you by your health care provider. Make sure you discuss any questions you have with your health care provider. Document Released: 03/13/2005 Document Revised: 02/26/2016 Document Reviewed: 02/26/2016 Elsevier Interactive Patient Education  2017 Elsevier Inc.     Viral Respiratory Infection A respiratory infection is an illness that affects part of the respiratory system, such as the lungs, nose, or throat. Most respiratory infections are caused by either viruses or bacteria. A respiratory infection that is caused by a virus is called a viral respiratory infection. Common types of viral respiratory infections include:  A cold.  The flu (influenza).  A respiratory syncytial virus (RSV) infection.  How do I know if I have a viral respiratory infection? Most viral respiratory infections cause:  A stuffy or runny nose.  Yellow or green nasal discharge.  A cough.  Sneezing.  Fatigue.  Achy muscles.  A sore throat.  Sweating or chills.  A fever.  A headache.  How are viral respiratory infections treated? If influenza is diagnosed early, it may be treated with an antiviral medicine that shortens the length of time a person has symptoms. Symptoms of viral respiratory infections may be treated with over-the-counter and prescription medicines, such as:  Expectorants. These make it easier to cough up mucus.  Decongestant nasal sprays.  Health care providers do not prescribe antibiotic medicines for viral infections. This is because antibiotics are designed to kill bacteria. They have no effect on viruses. How do I know if I should stay  home from work or school? To avoid exposing others to your respiratory infection, stay home if you have:  A fever.  A persistent cough.  A sore throat.  A runny nose.  Sneezing.  Muscles aches.  Headaches.  Fatigue.  Weakness.  Chills.  Sweating.  Nausea.  Follow these instructions at home:  Rest as much as possible.  Take over-the-counter and prescription medicines only as told by your health care provider.  Drink enough fluid to keep your urine clear or pale yellow. This helps prevent dehydration and helps loosen up mucus.  Gargle with a salt-water mixture 3-4 times per day or as needed. To make a salt-water mixture, completely dissolve -1 tsp of salt in 1 cup of warm water.  Use nose drops made from salt water to ease congestion and soften raw skin around your nose.  Do not drink alcohol.  Do not use tobacco products, including cigarettes, chewing tobacco, and e-cigarettes. If you need help quitting, ask your health care provider. Contact a health care provider if:  Your symptoms last for 10 days or longer.  Your symptoms get worse over time.  You have a fever.  You have severe sinus pain in your face or forehead.  The glands in your jaw or neck become very swollen. Get help right away if:  You feel pain or pressure in your chest.  You have shortness of breath.  You faint or feel like you will faint.  You have severe and persistent vomiting.  You feel confused or disoriented. This information is not intended to replace advice given to you by your health care provider. Make sure you discuss any questions you have with your health care provider. Document Released: 03/13/2005 Document Revised: 11/09/2015 Document Reviewed: 11/09/2014 Elsevier Interactive Patient Education  2018 [employer].     IF you received an x-ray today, you will receive an invoice from [facility]. Please contact [facility] at [phone] with  questions or concerns regarding your invoice.   IF you received labwork today, you will receive an invoice from [facility]. Please contact [facility] at [phone] with questions or concerns regarding your invoice.   Our billing staff will not be able to assist you with questions regarding bills from these companies.  You will be contacted with the lab results as soon as they are available. The fastest way to get your results is to activate your My Chart account. Instructions are located on the last page of this paperwork. If you have not heard from [place] regarding the results in 2 weeks, please contact this office.

## 2017-07-09 NOTE — Telephone Encounter (Signed)
[facility].

## 2017-07-10 NOTE — Telephone Encounter (Signed)
Pt notified and verbalized understanding.

## 2017-07-10 NOTE — Telephone Encounter (Signed)
Please let patient know that his troponin level, a marker for heart damage, was completely normal. RTC if symptoms persist.

## 2017-10-06 ENCOUNTER — Ambulatory Visit: Payer: BLUE CROSS/BLUE SHIELD | Admitting: Physician Assistant

## 2017-10-06 ENCOUNTER — Other Ambulatory Visit: Payer: Self-pay

## 2017-10-06 VITALS — BP 122/80 | HR 90 | Temp 98.1°F | Resp 16 | Ht 72.0 in | Wt 211.0 lb

## 2017-10-06 DIAGNOSIS — J069 Acute upper respiratory infection, unspecified: Secondary | ICD-10-CM

## 2017-10-06 MED ORDER — GUAIFENESIN ER 1200 MG PO TB12: 1.0000 | ORAL_TABLET | Freq: Two times a day (BID) | ORAL | 1 refills | Status: DC | PRN

## 2017-10-06 MED ORDER — BENZONATATE 100 MG PO CAPS: 100.0000 mg | ORAL_CAPSULE | Freq: Three times a day (TID) | ORAL | 0 refills | Status: DC | PRN

## 2017-10-06 MED ORDER — FLUTICASONE PROPIONATE 50 MCG/ACT NA SUSP: 2.0000 | Freq: Every day | NASAL | 12 refills | Status: DC

## 2017-10-06 NOTE — Patient Instructions (Signed)
Make sure you are hydrating well with water.  Upper Respiratory Infection, Adult Most upper respiratory infections (URIs) are caused by a virus. A URI affects the nose, throat, and upper air passages. The most common type of URI is often called "the common cold." Follow these instructions at home:  Take medicines only as told by your doctor.  Gargle warm saltwater or take cough drops to comfort your throat as told by your doctor.  Use a warm mist humidifier or inhale steam from a shower to increase air moisture. This may make it easier to breathe.  Drink enough fluid to keep your pee (urine) clear or pale yellow.  Eat soups and other clear broths.  Have a healthy diet.  Rest as needed.  Go back to work when your fever is gone or your doctor says it is okay. ? You may need to stay home longer to avoid giving your URI to others. ? You can also wear a face mask and wash your hands often to prevent spread of the virus.  Use your inhaler more if you have asthma.  Do not use any tobacco products, including cigarettes, chewing tobacco, or electronic cigarettes. If you need help quitting, ask your doctor. Contact a doctor if:  You are getting worse, not better.  Your symptoms are not helped by medicine.  You have chills.  You are getting more short of breath.  You have brown or red mucus.  You have yellow or brown discharge from your nose.  You have pain in your face, especially when you bend forward.  You have a fever.  You have puffy (swollen) neck glands.  You have pain while swallowing.  You have white areas in the back of your throat. Get help right away if:  You have very bad or constant: ? Headache. ? Ear pain. ? Pain in your forehead, behind your eyes, and over your cheekbones (sinus pain). ? Chest pain.  You have long-lasting (chronic) lung disease and any of the following: ? Wheezing. ? Long-lasting cough. ? Coughing up blood. ? A change in your usual  mucus.  You have a stiff neck.  You have changes in your: ? Vision. ? Hearing. ? Thinking. ? Mood. This information is not intended to replace advice given to you by your health care provider. Make sure you discuss any questions you have with your health care provider. Document Released: 11/20/2007 Document Revised: 02/04/2016 Document Reviewed: 09/08/2013 Elsevier Interactive Patient Education  2018 [employer]

## 2017-10-06 NOTE — Progress Notes (Signed)
[facility] [address], [place] [phone]  Date:  10/06/2017   Name:  Wayne LampMichael S Scalese   DOB:  Aug 14, 1985   MRN:  [id number]  PCP:  Wallis BambergMani, Mario, PA-C    History of Present Illness:  Wayne Ballard is a 32 y.o. male patient who presents to PCP with  Chief Complaint  Patient presents with  . Sinusitis    x friday     4 days of congestion, body aches, cough, and runny nose.   He has used aleve which has helped some.  No known sick contacts.    Patient Active Problem List   Diagnosis Date Noted  . NSTEMI (non-ST elevated myocardial infarction) (HCC) 02/14/2017  . Myopericarditis 02/14/2017    Past Medical History:  Diagnosis Date  . ADHD 1995    Past Surgical History:  Procedure Laterality Date  . LEFT HEART CATH AND CORONARY ANGIOGRAPHY N/A 03/11/2017   Procedure: LEFT HEART CATH AND CORONARY ANGIOGRAPHY;  Surgeon: Yates DecampGanji, Jay, MD;  Location: [facility] INVASIVE CV LAB;  Service: Cardiovascular;  Laterality: N/A;    Social History   Tobacco Use  . Smoking status: Current Every Day Smoker    Packs/day: 1.00    Years: 8.00    Pack years: 8.00    Types: Cigarettes  . Smokeless tobacco: Never Used  Substance Use Topics  . Alcohol use: Yes    Comment: Social  . Drug use: Yes    Types: Marijuana    Comment: Used heroin approx 5 months ago, distant cocaine use (years), no methamphetamine    No family history on file.  No Known Allergies  Medication list has been reviewed and updated.  Current Outpatient Medications on File Prior to Visit  Medication Sig Dispense Refill  . aspirin EC 81 MG tablet Take 81 mg by mouth daily.    . benzonatate (TESSALON) 100 MG capsule Take 1-2 capsules (100-200 mg total) by mouth 3 (three) times daily as needed. (Patient not taking: Reported on 10/06/2017) 60 capsule 0  . colchicine 0.6 MG tablet Take 1 tablet (0.6 mg total) by mouth 2 (two) times daily. (Patient not taking: Reported on 10/06/2017) 60 tablet 2  .  HYDROcodone-homatropine (HYCODAN) 5-1.5 MG/5ML syrup Take 5 mLs by mouth at bedtime as needed. (Patient not taking: Reported on 10/06/2017) 100 mL 0  . pseudoephedrine (SUDAFED) 60 MG tablet Take 1 tablet (60 mg total) by mouth every 8 (eight) hours as needed. (Patient not taking: Reported on 10/06/2017) 30 tablet 0  . rosuvastatin (CRESTOR) 5 MG tablet Take 5 mg by mouth daily.     No current facility-administered medications on file prior to visit.     ROS ROS otherwise unremarkable unless listed above.  Physical Examination: BP 122/80   Pulse 90   Temp 98.1 F (36.7 C) (Oral)   Resp 16   Ht 6' (1.829 m)   Wt 211 lb (95.7 kg)   SpO2 97%   BMI 28.62 kg/m  Ideal Body Weight: Weight in (lb) to have BMI = 25: 183.9  Physical Exam  Constitutional: He is oriented to person, place, and time. He appears well-developed and well-nourished. No distress.  HENT:  Head: Atraumatic.  Right Ear: Tympanic membrane, external ear and ear canal normal.  Left Ear: Tympanic membrane, external ear and ear canal normal.  Nose: Mucosal edema and rhinorrhea present. Right sinus exhibits no maxillary sinus tenderness and no frontal sinus tenderness. Left sinus exhibits no maxillary sinus  tenderness and no frontal sinus tenderness.  Mouth/Throat: No uvula swelling. No oropharyngeal exudate, posterior oropharyngeal edema or posterior oropharyngeal erythema.  Eyes: Pupils are equal, round, and reactive to light. Conjunctivae, EOM and lids are normal. Right eye exhibits normal extraocular motion. Left eye exhibits normal extraocular motion.  Neck: Trachea normal and full passive range of motion without pain. No edema and no erythema present.  Cardiovascular: Normal rate.  Pulmonary/Chest: Effort normal. No respiratory distress. He has no decreased breath sounds. He has no wheezes. He has no rhonchi.  Neurological: He is alert and oriented to person, place, and time.  Skin: Skin is warm and dry. He is not  diaphoretic.  Psychiatric: He has a normal mood and affect. His behavior is normal.     Assessment and Plan: MARZELL ISAKSON is a 32 y.o. male who is here today for cc of  Chief Complaint  Patient presents with  . Sinusitis    x friday   Appears viral in etiology.   We will treat supportively. If symptoms do not improve within 5 days, advised to contact. We can treat with augmentin bid for 7 days. Acute upper respiratory infection - Plan: fluticasone (FLONASE) 50 MCG/ACT nasal spray, Guaifenesin (MUCINEX MAXIMUM STRENGTH) 1200 MG TB12, benzonatate (TESSALON) 100 MG capsule  Trena Platt, PA-C [facility] 4/23/20199:58 AM

## 2017-10-07 ENCOUNTER — Encounter: Payer: Self-pay | Admitting: Physician Assistant

## 2017-12-04 ENCOUNTER — Other Ambulatory Visit: Payer: Self-pay

## 2017-12-04 ENCOUNTER — Emergency Department (HOSPITAL_COMMUNITY): Payer: BLUE CROSS/BLUE SHIELD

## 2017-12-04 ENCOUNTER — Encounter (HOSPITAL_COMMUNITY): Payer: Self-pay

## 2017-12-04 ENCOUNTER — Emergency Department (HOSPITAL_COMMUNITY)
Admission: EM | Admit: 2017-12-04 | Discharge: 2017-12-04 | Disposition: A | Discharge status: Home / Self Care | Payer: BLUE CROSS/BLUE SHIELD | Attending: Emergency Medicine | Admitting: Emergency Medicine

## 2017-12-04 DIAGNOSIS — F17210 Nicotine dependence, cigarettes, uncomplicated: Secondary | ICD-10-CM | POA: Diagnosis not present

## 2017-12-04 DIAGNOSIS — R0789 Other chest pain: Secondary | ICD-10-CM | POA: Diagnosis not present

## 2017-12-04 DIAGNOSIS — R0602 Shortness of breath: Secondary | ICD-10-CM | POA: Diagnosis present

## 2017-12-04 DIAGNOSIS — I252 Old myocardial infarction: Secondary | ICD-10-CM | POA: Diagnosis not present

## 2017-12-04 LAB — URINALYSIS, ROUTINE W REFLEX MICROSCOPIC
Bilirubin Urine: NEGATIVE
Glucose, UA: NEGATIVE mg/dL
Hgb urine dipstick: NEGATIVE
Ketones, ur: 5 mg/dL — AB
LEUKOCYTES UA: NEGATIVE
NITRITE: NEGATIVE
PROTEIN: NEGATIVE mg/dL
Specific Gravity, Urine: 1.021 (ref 1.005–1.030)
pH: 5 (ref 5.0–8.0)

## 2017-12-04 LAB — CBC WITH DIFFERENTIAL/PLATELET
BASOS ABS: 0 10*3/uL (ref 0.0–0.1)
Basophils Relative: 0 %
EOS ABS: 0.2 10*3/uL (ref 0.0–0.7)
Eosinophils Relative: 2 %
HCT: 48.5 % (ref 39.0–52.0)
HEMOGLOBIN: 17.3 g/dL — AB (ref 13.0–17.0)
LYMPHS ABS: 2.4 10*3/uL (ref 0.7–4.0)
Lymphocytes Relative: 32 %
MCH: 29.8 pg (ref 26.0–34.0)
MCHC: 35.7 g/dL (ref 30.0–36.0)
MCV: 83.5 fL (ref 78.0–100.0)
Monocytes Absolute: 0.5 10*3/uL (ref 0.1–1.0)
Monocytes Relative: 7 %
NEUTROS PCT: 59 %
Neutro Abs: 4.6 10*3/uL (ref 1.7–7.7)
Platelets: 161 10*3/uL (ref 150–400)
RBC: 5.81 MIL/uL (ref 4.22–5.81)
RDW: 13.4 % (ref 11.5–15.5)
WBC: 7.7 10*3/uL (ref 4.0–10.5)

## 2017-12-04 LAB — BASIC METABOLIC PANEL
ANION GAP: 8 (ref 5–15)
BUN: 11 mg/dL (ref 6–20)
CO2: 24 mmol/L (ref 22–32)
Calcium: 8.9 mg/dL (ref 8.9–10.3)
Chloride: 110 mmol/L (ref 101–111)
Creatinine, Ser: 0.93 mg/dL (ref 0.61–1.24)
Glucose, Bld: 89 mg/dL (ref 65–99)
POTASSIUM: 3.8 mmol/L (ref 3.5–5.1)
SODIUM: 142 mmol/L (ref 135–145)

## 2017-12-04 LAB — RAPID URINE DRUG SCREEN, HOSP PERFORMED
Amphetamines: NOT DETECTED
Benzodiazepines: NOT DETECTED
COCAINE: NOT DETECTED
OPIATES: NOT DETECTED
Tetrahydrocannabinol: POSITIVE — AB

## 2017-12-04 LAB — TROPONIN I: Troponin I: <0.03 ng/mL (ref <0.03)

## 2017-12-04 LAB — ETHANOL

## 2017-12-04 MED ORDER — HYDROXYZINE HCL 25 MG PO TABS
25.0000 mg | ORAL_TABLET | Freq: Once | ORAL | Status: AC
  Administered 2017-12-04: 25 mg via ORAL
  Filled 2017-12-04: qty 1

## 2017-12-04 MED ORDER — HYDROXYZINE HCL 25 MG PO TABS: 25.0000 mg | ORAL_TABLET | Freq: Four times a day (QID) | ORAL | 0 refills | Status: DC | PRN

## 2017-12-04 NOTE — Discharge Instructions (Addendum)
Take Vistaril as needed as prescribed for anxiety. Recheck with your PCP week. Return to ER for worsening or concerning symptoms.Substance Abuse Treatment Programs  Intensive Outpatient Programs [facility]     [address]      [place], [place]                   [phone]       [facility] [address] [place], [place] [phone]  [facility]     (Inpatient and outpatient)     [address]           [phone]    [facility] [phone] (Suboxone and Methadone)  [address]      [place], [place] [address]      [phone]       [address] [place], [place] [phone]  [facility] (Outpatient/Inpatient, [facility])    (insurance only) [phone]             [facility] (Groups & Residential) [place], [place] [phone]     [facility]     [address]     [place], [place]      [phone]       [facility] (for caregivers and family) [phone] [address] [place], [place] [phone]      Residential Treatment Programs [facility]      [address], [place], [place] [address]  [phone]       [facility] [address] [place], [place] [address] [phone]  [facility]        [phone]       [facility] [phone]  [facility]             [address]                                         [place], [place]                                                [phone] or [phone]                               [facility] [place] [address] [place], [phone]  [facility]    [address]      [place], [place]     [phone]       [facility] [address] [place], [place] [phone]  [facility]   [address]     [place], [place]  [address]     [phone]      Admissions: 8am-3pm M-F  [facility] ([facility]) [address] [place], [place] [phone]  [facility]: Residential Program [phone](90 Days)   [place], [place]      [phone] or [phone]     [facility]: [facility] [place], [place] (Walk in Hours over the weekend or by referral)  [facility] [address] [place], [place], [place] [address] [phone]  Crisis Mobile: [facility]:  [phone] (for crisis response 24 hours  a day) [facility] Hotline:      [phone] Outpatient Psychiatry and Counseling  [facility]: Mobile Crisis Management 24 hours:  [phone]  [facility] of the [employer] sliding scale fee and walk in schedule: M-F 8am-12pm/1pm-3pm [address]  [place], [place] [address] [phone]  [facility] [address] [place], [place] [address] [phone]  [facility] (Formerly known as [facility] [employer])- new patient walk-in appointments available Monday - Friday 8am -3pm.          [address] [place], [place] [address] [phone] or crisis line- [phone]  [facility]/ Intensive Outpatient Therapy Program [address] [place], [place] [address] [phone]  [facility]                  Crisis Services      [phone] [address]     [place], [place] [address]                 [facility]   [facility] [phone]. [address] [place], [place] [address]   [employer] of Care          [address]  [place], [place] [address]       [phone]  [facility] [address] [place], [place] [address] [phone]  [facility]    [address]    [place], [place] [address]     [phone]       Andee Poles,  MD     [address]     [place] [address]     [phone]       [facility] [address] [place] [address]  [facility]     [address] [place], [place]      [phone]       [facility] Eulogio Ditch, MD [address] [place], [place] [address] [phone]  [facility]     [address]     [place], [place] [address]     [phone]       [facility] [address] [place], [place] [address] [phone] Resources for Temporary Residential Assistance/Crisis Centers  [facility]) M-F 8am-3pm   [address] [place], [place] [address]   [phone] Services include: laundry, barbering, support groups, case management, phone  & computer access, showers, AA/NA mtgs, mental health/substance abuse nurse, job skills class, disability information, VA assistance, spiritual classes, etc.   HOMELESS SHELTERS  [facility]     [facility]   [address] [place]     [phone]              [employer] (women and children)       [address] [place], [place] [address] [phone] [email or web address] for application and process Application Required  [facility]   [address]    [place] [address]     [phone]                    [facility] [address] [place], [place] [address] [phone] application appt.) Application Required  [facility] (women only)    [address]     [place], [place] [address]     [phone]      Intake starts 6pm daily Need valid ID, SSC, & Police report [employer] [address] [place], [place] [phone] Application Required  [employer] (men only)     [address]      [place],  [place]     [phone]       [facility] [place] (Pregnant women only) [address] [place], [place] [phone]  [facility]      [address]      [place], [place] [address]     [phone]             [facility] [address] [place], [place] [phone] 90 day commitment/SA/Application process  [facility](men only)     [address]     [place], [place]     [phone]       Check-in at [facility] [address] [place], [place] [address] [phone] Men/Women/Women and Children must be there by 7 pm  [facility] [place], [place] [phone]

## 2017-12-04 NOTE — ED Triage Notes (Signed)
Pt complains of a possible anxiety attack, he's never been diagnosed with them and hasn't ever taken any medications Pt complains of being short of breath and his chest hurting

## 2017-12-04 NOTE — ED Provider Notes (Signed)
Medical screening examination/treatment/procedure(s) were conducted as a shared visit with non-physician practitioner(s) and myself.  I personally evaluated the patient during the encounter.  Clinical Impression:   Final diagnoses:  Chest tightness  SOB (shortness of breath)    The patient had a recent admission to the hospital with an elevated troponin, was found to likely have some type of myopericarditis, negative heart catheterization in follow-up.  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  On my exam the patient states he is now symptom-free and has no complaints.  Clear lungs, clear heart sounds, no murmur, no edema, well-appearing.  EKG unremarkable, troponin unremarkable, patient stable for discharge, will need to follow-up in the outpatient setting.   EKG Interpretation  Date/Time:  Thursday December 04 2017 20:03:02 EDT Ventricular Rate:  82 PR Interval:    QRS Duration: 82 QT Interval:  366 QTC Calculation: 428 R Axis:   73 Text Interpretation:  Sinus rhythm since last tracing no significant change Confirmed by Mancel BaleWentz, Elliott [phone] on 12/04/2017 8:54:18 PM         Eber HongMiller, Dhanya Bogle, MD 12/05/17 1625

## 2017-12-04 NOTE — ED Provider Notes (Signed)
[facility] Provider Note   CSN: [id number] Arrival date & time: 12/04/17  1921     History   Chief Complaint Chief Complaint  Patient presents with  . Panic Attack    HPI ARBEN PACKMAN is a 32 y.o. male.  32 year old male presents with complaint of panic attack.  Patient states that he was driving into work today around 5 PM when he began to feel short of breath and tightness chest.  Patient states that he was sweaty, nauseous, his mouth was dry.  Patient states that his symptoms escalated as he came into the ER tonight and have improved while resting in the room tonight.  States that this time he has mild midsternal chest tightness.  Chest discomfort does not radiate, is constant.  Patient states he is under a lot of stress at home and at work, works for [employer] doing a lot of heavy lifting.  Denies history of similar episodes.  Patient states that he was admitted to the hospital in August of last year for a mild heart attack, diagnosed with pericarditis and is supposed to be taking colchicine but is not.  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  No relevant family history.  Current everyday smoker, no history of high blood pressure high cholesterol.  No other complaints or concerns.     Past Medical History:  Diagnosis Date  . ADHD 1995    Patient Active Problem List   Diagnosis Date Noted  . NSTEMI (non-ST elevated myocardial infarction) (HCC) 02/14/2017  . Myopericarditis 02/14/2017    Past Surgical History:  Procedure Laterality Date  . LEFT HEART CATH AND CORONARY ANGIOGRAPHY N/A 03/11/2017   Procedure: LEFT HEART CATH AND CORONARY ANGIOGRAPHY;  Surgeon: Yates Decamp, MD;  Location: [facility] INVASIVE CV LAB;  Service: Cardiovascular;  Laterality: N/A;        Home Medications    Prior to Admission medications   Medication Sig Start Date End Date Taking? Authorizing Provider  hydrOXYzine  (ATARAX/VISTARIL) 25 MG tablet Take 1 tablet (25 mg total) by mouth every 6 (six) hours as needed for anxiety. 12/04/17   Jeannie Fend, PA-C    Family History History reviewed. No pertinent family history.  Social History Social History   Tobacco Use  . Smoking status: Current Every Day Smoker    Packs/day: 1.00    Years: 8.00    Pack years: 8.00    Types: Cigarettes  . Smokeless tobacco: Never Used  Substance Use Topics  . Alcohol use: Yes    Comment: Social  . Drug use: Yes    Types: Marijuana    Comment: Used heroin approx 5 months ago, distant cocaine use (years), no methamphetamine     Allergies   Patient has no known allergies.   Review of Systems Review of Systems  Constitutional: Positive for diaphoresis. Negative for chills and fever.  Respiratory: Positive for chest tightness and shortness of breath. Negative for wheezing.   Cardiovascular: Negative for chest pain and palpitations.  Gastrointestinal: Positive for nausea. Negative for abdominal pain, constipation, diarrhea and vomiting.  Musculoskeletal: Negative for back pain, neck pain and neck stiffness.  Skin: Negative for rash and wound.  Allergic/Immunologic: Negative for immunocompromised state.  Neurological: Negative for dizziness, weakness and headaches.  Hematological: Does not bruise/bleed easily.  Psychiatric/Behavioral: Negative for confusion.  All other systems reviewed and are negative.    Physical Exam Updated Vital Signs  BP 120/82 (BP Location: Right Arm)   Pulse 75   Temp 98.7 F (37.1 C) (Oral)   Resp 16   Ht 6' (1.829 m)   Wt 99.8 kg (220 lb)   SpO2 97%   BMI 29.84 kg/m   Physical Exam  Constitutional: He is oriented to person, place, and time. He appears well-developed and well-nourished. No distress.  HENT:  Head: Normocephalic and atraumatic.  Eyes: Conjunctivae are normal.  Cardiovascular: Normal rate, regular rhythm, normal heart sounds and intact distal pulses.  No  murmur heard. Pulmonary/Chest: Effort normal and breath sounds normal. No respiratory distress. He exhibits no tenderness.  Abdominal: Soft. He exhibits no distension. There is no tenderness.  Neurological: He is alert and oriented to person, place, and time.  Skin: Skin is warm and dry. No rash noted. He is not diaphoretic.  Psychiatric: He has a normal mood and affect. His behavior is normal.  Nursing note and vitals reviewed.    ED Treatments / Results  Labs (all labs ordered are listed, but only abnormal results are displayed) Labs Reviewed  CBC WITH DIFFERENTIAL/PLATELET - Abnormal; Notable for the following components:      Result Value   Hemoglobin 17.3 (*)    All other components within normal limits  URINALYSIS, ROUTINE W REFLEX MICROSCOPIC - Abnormal; Notable for the following components:   Ketones, ur 5 (*)    All other components within normal limits  RAPID URINE DRUG SCREEN, HOSP PERFORMED - Abnormal; Notable for the following components:   Tetrahydrocannabinol POSITIVE (*)    Barbiturates   (*)    Value: Result not available. Reagent lot number recalled by manufacturer.   All other components within normal limits  ETHANOL  BASIC METABOLIC PANEL  TROPONIN I    EKG EKG Interpretation  Date/Time:  Thursday December 04 2017 20:03:02 EDT Ventricular Rate:  82 PR Interval:    QRS Duration: 82 QT Interval:  366 QTC Calculation: 428 R Axis:   73 Text Interpretation:  Sinus rhythm since last tracing no significant change Confirmed by Mancel Bale [phone]) on 12/04/2017 8:54:18 PM   Radiology Dg Chest 2 View  Result Date: 12/04/2017 CLINICAL DATA:  Anxiety attack at work accompanied by shortness of breath and chest pain. Heart attack in August 2018. current smoker. EXAM: CHEST - 2 VIEW COMPARISON:  07/07/2017 FINDINGS: The heart size and mediastinal contours are within normal limits. Both lungs are clear. The visualized skeletal structures are unremarkable. IMPRESSION:  No active cardiopulmonary disease. Electronically Signed   By: Burman Nieves M.D.   On: 12/04/2017 21:35    Procedures Procedures (including critical care time)  Medications Ordered in ED Medications  hydrOXYzine (ATARAX/VISTARIL) tablet 25 mg (25 mg Oral Given 12/04/17 2155)     Initial Impression / Assessment and Plan / ED Course  I have reviewed the triage vital signs and the nursing notes.  Pertinent labs & imaging results that were available during my care of the patient were reviewed by me and considered in my medical decision making (see chart for details).  Clinical Course as of Dec 05 2254  Thu Dec 04, 2017  2249 31yo male presents with complaint of mid sternal chest tightness and SHOB onset tonight around 5 PM today while headed to work.  Patient states he was also diaphoretic and nauseous with a dry mouth.  Patient came to the emergency room uncertain if he was having a panic attack.  Due to his recent history of admission in  August 2018 for elevated troponin likely myocarditis. Patient did follow up with cardiology and had a negative heart cath.  Patient's evaluation today is unremarkable, his urine drug screen is positive for marijuana, urinalysis with 5 ketones, troponin is negative, his alcohol is negative, CBC is fairly normal no evidence of anemia, and his BMP also shows normal renal function.  Patient's   [LM]    Clinical Course User Index [LM] Jeannie FendMurphy, Mehreen Azizi A, PA-C    Final Clinical Impressions(s) / ED Diagnoses   Final diagnoses:  Chest tightness  SOB (shortness of breath)    ED Discharge Orders        Ordered    hydrOXYzine (ATARAX/VISTARIL) 25 MG tablet  Every 6 hours PRN     12/04/17 2248       Jeannie FendMurphy, Micharl Helmes A, PA-C 12/04/17 2256    Eber HongMiller, Brian, MD 12/06/17 1623

## 2018-01-28 ENCOUNTER — Ambulatory Visit: Payer: BLUE CROSS/BLUE SHIELD | Admitting: Urgent Care

## 2018-01-28 ENCOUNTER — Ambulatory Visit (INDEPENDENT_AMBULATORY_CARE_PROVIDER_SITE_OTHER): Payer: BLUE CROSS/BLUE SHIELD

## 2018-01-28 ENCOUNTER — Encounter: Payer: Self-pay | Admitting: Urgent Care

## 2018-01-28 VITALS — BP 125/82 | HR 88 | Temp 98.2°F | Resp 18 | Ht 72.0 in | Wt 198.4 lb

## 2018-01-28 DIAGNOSIS — F17200 Nicotine dependence, unspecified, uncomplicated: Secondary | ICD-10-CM | POA: Diagnosis not present

## 2018-01-28 DIAGNOSIS — Z8659 Personal history of other mental and behavioral disorders: Secondary | ICD-10-CM

## 2018-01-28 DIAGNOSIS — F419 Anxiety disorder, unspecified: Secondary | ICD-10-CM

## 2018-01-28 DIAGNOSIS — I252 Old myocardial infarction: Secondary | ICD-10-CM

## 2018-01-28 DIAGNOSIS — F1191 Opioid use, unspecified, in remission: Secondary | ICD-10-CM

## 2018-01-28 DIAGNOSIS — I319 Disease of pericardium, unspecified: Secondary | ICD-10-CM

## 2018-01-28 DIAGNOSIS — R059 Cough, unspecified: Secondary | ICD-10-CM

## 2018-01-28 DIAGNOSIS — R0602 Shortness of breath: Secondary | ICD-10-CM

## 2018-01-28 DIAGNOSIS — R05 Cough: Secondary | ICD-10-CM

## 2018-01-28 DIAGNOSIS — R0789 Other chest pain: Secondary | ICD-10-CM

## 2018-01-28 DIAGNOSIS — Z87898 Personal history of other specified conditions: Secondary | ICD-10-CM

## 2018-01-28 DIAGNOSIS — R0981 Nasal congestion: Secondary | ICD-10-CM

## 2018-01-28 DIAGNOSIS — F329 Major depressive disorder, single episode, unspecified: Secondary | ICD-10-CM

## 2018-01-28 MED ORDER — LEVOCETIRIZINE DIHYDROCHLORIDE 5 MG PO TABS: 5.0000 mg | ORAL_TABLET | Freq: Every evening | ORAL | 1 refills | Status: DC

## 2018-01-28 MED ORDER — MONTELUKAST SODIUM 10 MG PO TABS: 10.0000 mg | ORAL_TABLET | Freq: Every day | ORAL | 1 refills | Status: DC

## 2018-01-28 MED ORDER — TRAZODONE HCL 50 MG PO TABS: 50.0000 mg | ORAL_TABLET | Freq: Every evening | ORAL | 1 refills | Status: DC | PRN

## 2018-01-28 MED ORDER — FLUTICASONE PROPIONATE 50 MCG/ACT NA SUSP: 2.0000 | Freq: Every day | NASAL | 11 refills | Status: DC

## 2018-01-28 MED ORDER — ESCITALOPRAM OXALATE 10 MG PO TABS: 10.0000 mg | ORAL_TABLET | Freq: Every day | ORAL | 1 refills | Status: DC

## 2018-01-28 NOTE — Progress Notes (Signed)
MRN: [id number] DOB: 05-26-1986  Subjective:   Wayne LampMichael S Ballard is a 32 y.o. male with pmh of myopericarditis presenting for follow up on 1 week history of recurrent chest tightness, shob, mildly productive cough. Works in [employer], has a very hot environment; shob, chest tightness are worse when he is outdoors and working. Denies fever, chest pain, n/v, abdominal pain. Smokes 1.5-2ppd. 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 Has a difficult living situation, had a falling out with his roommate. [place] friend in [place] but works at [employer] [place]. Smokes marijuana. He would like to look into therapy.   Wayne Ballard has a current medication list which includes the following prescription(s): hydroxyzine. Also has No Known Allergies.  Wayne Ballard  has a past medical history of ADHD (1995). Also  has a past surgical history that includes LEFT HEART CATH AND CORONARY ANGIOGRAPHY (N/A, 03/11/2017).  Objective:   Vitals: BP 125/82   Pulse 88   Temp 98.2 F (36.8 C) (Oral)   Resp 18   Ht 6' (1.829 m)   Wt 198 lb 6.4 oz (90 kg)   SpO2 100%   BMI 26.91 kg/m   Physical Exam  Constitutional: He is oriented to person, place, and time. He appears well-developed and well-nourished.  HENT:  Mouth/Throat: Oropharynx is clear and moist.  Eyes: Right eye exhibits no discharge. Left eye exhibits no discharge. No scleral icterus.  Cardiovascular: Normal rate, regular rhythm, normal heart sounds and intact distal pulses. Exam reveals no gallop and no friction rub.  No murmur heard. Pulmonary/Chest: Effort normal and breath sounds normal. No stridor. No respiratory distress. He has no wheezes. He has no rales.  Neurological: He is alert and oriented to person, place, and time.  Skin: Skin is warm and dry.   ECG interpretation - Sinus bradycardia at 59 bpm.  There is nonspecific T wave flattening in lead aVL.  ECG is comparable to that of 12/05/2017.  Dg Chest  2 View  Result Date: 01/28/2018 CLINICAL DATA:  Acute chest tightness and shortness of breath. EXAM: CHEST - 2 VIEW COMPARISON:  12/04/2017 FINDINGS: The cardiomediastinal silhouette is unremarkable. There is no evidence of focal airspace disease, pulmonary edema, suspicious pulmonary nodule/mass, pleural effusion, or pneumothorax. No acute bony abnormalities are identified. IMPRESSION: No active cardiopulmonary disease. Electronically Signed   By: Harmon PierJeffrey  Hu M.D.   On: 01/28/2018 10:59   Assessment and Plan :   Chest tightness - Plan: EKG 12-Lead, DG Chest 2 View  Cough  Shortness of breath - Plan: DG Chest 2 View  Tobacco use disorder  Anxiety and depression  History of non-ST elevation myocardial infarction (NSTEMI) - Plan: EKG 12-Lead, DG Chest 2 View  Myopericarditis - Plan: DG Chest 2 View  Nasal congestion  History of heroin use  Reassured patient with his EKG and chest x-ray.  He has a difficult life situation right now and will try to manage him for anxiety and depression.  We will use Lexapro for this and trazodone to help him with insomnia.  Provided patient with information for behavioral therapy.  Patient is high risk and therefore I cannot use any benzodiazepines given his history of heroin use.  Counseled against using other medications from his friends, he agreed to this.  We will try to have close follow-up and patient will return to clinic when I am back from [place].  Wallis BambergMario Kanton Kamel, PA-C [facility] [phone] 01/28/2018 10:18 AM

## 2018-01-28 NOTE — Patient Instructions (Addendum)
[facility] [address] [place], [place] [address]  Shanon RosserBarbara Farran [phone]  Maris BergerKathy Kirstner [phone]  Marco CollieSusan Kroll-Smith [phone]   [facility] ([address]Beth Hulan AmatoKincaid, Ernest Beckey RutterMcCoy, Heather Joycelyn SchmidKitchens, Karla Whitesburgownsend) - [phone]  [facility](Julie [place]) - [phone]  [facility] - [phone]  [facility] - [phone]  Buena IrishBob Mylan - [phone]  [facility]  - [phone] (do not file insurance)      I will contact you with your lab results within the next 2 weeks.  If you have not heard from us then please contact us. The fastest way to get your results is to register for My Chart.    Escitalopram tablets What is this medicine? ESCITALOPRAM (es sye TAL oh pram) is used to treat depression and certain types of anxiety. This medicine may be used for other purposes; ask your health care provider or pharmacist if you have questions. COMMON BRAND NAME(S): Lexapro What should I tell my health care provider before I take this medicine? They need to know if you have any of these conditions: -bipolar disorder or a family history of bipolar disorder -diabetes -glaucoma -heart disease -kidney or liver disease -receiving electroconvulsive therapy -seizures (convulsions) -suicidal thoughts, plans, or attempt by you or a family member -an unusual or allergic reaction to escitalopram, the related drug citalopram, other medicines, foods, dyes, or preservatives -pregnant or trying to become pregnant -breast-feeding How should I use this medicine? Take this medicine by mouth with a glass of water. Follow the directions on the prescription label. You can take it with or without food. If it upsets your stomach, take it with food. Take your medicine at regular intervals. Do not take it more often than directed. Do not stop taking this medicine suddenly  except upon the advice of your doctor. Stopping this medicine too quickly may cause serious side effects or your condition may worsen. A special MedGuide will be given to you by the pharmacist with each prescription and refill. Be sure to read this information carefully each time. Talk to your pediatrician regarding the use of this medicine in children. Special care may be needed. Overdosage: If you think you have taken too much of this medicine contact a poison control center or emergency room at once. NOTE: This medicine is only for you. Do not share this medicine with others. What if I miss a dose? If you miss a dose, take it as soon as you can. If it is almost time for your next dose, take only that dose. Do not take double or extra doses. What may interact with this medicine? Do not take this medicine with any of the following medications: -certain medicines for fungal infections like fluconazole, itraconazole, ketoconazole, posaconazole, voriconazole -cisapride -citalopram -dofetilide -dronedarone -linezolid -MAOIs like Carbex, Eldepryl, Marplan, Nardil, and Parnate -methylene blue (injected into a vein) -pimozide -thioridazine -ziprasidone This medicine may also interact with the following medications: -alcohol -amphetamines -aspirin and aspirin-like medicines -carbamazepine -certain medicines for depression, anxiety, or psychotic disturbances -certain medicines for migraine headache like almotriptan, eletriptan, frovatriptan, naratriptan, rizatriptan, sumatriptan, zolmitriptan -certain medicines for sleep -certain medicines that treat or prevent blood clots like warfarin, enoxaparin, dalteparin -cimetidine -diuretics -fentanyl -furazolidone -isoniazid -lithium -metoprolol -NSAIDs, medicines for pain and inflammation, like ibuprofen or naproxen -other medicines that prolong the QT interval (cause an abnormal heart rhythm) -procarbazine -rasagiline -supplements like St.  John's wort, kava kava, valerian -tramadol -tryptophan This list may not describe all possible  interactions. Give your health care provider a list of all the medicines, herbs, non-prescription drugs, or dietary supplements you use. Also tell them if you smoke, drink alcohol, or use illegal drugs. Some items may interact with your medicine. What should I watch for while using this medicine? Tell your doctor if your symptoms do not get better or if they get worse. Visit your doctor or health care professional for regular checks on your progress. Because it may take several weeks to see the full effects of this medicine, it is important to continue your treatment as prescribed by your doctor. Patients and their families should watch out for new or worsening thoughts of suicide or depression. Also watch out for sudden changes in feelings such as feeling anxious, agitated, panicky, irritable, hostile, aggressive, impulsive, severely restless, overly excited and hyperactive, or not being able to sleep. If this happens, especially at the beginning of treatment or after a change in dose, call your health care professional. Bonita Quin may get drowsy or dizzy. Do not drive, use machinery, or do anything that needs mental alertness until you know how this medicine affects you. Do not stand or sit up quickly, especially if you are an older patient. This reduces the risk of dizzy or fainting spells. Alcohol may interfere with the effect of this medicine. Avoid alcoholic drinks. Your mouth may get dry. Chewing sugarless gum or sucking hard candy, and drinking plenty of water may help. Contact your doctor if the problem does not go away or is severe. What side effects may I notice from receiving this medicine? Side effects that you should report to your doctor or health care professional as soon as possible: -allergic reactions like skin rash, itching or hives, swelling of the face, lips, or tongue -anxious -black, tarry  stools -changes in vision -confusion -elevated mood, decreased need for sleep, racing thoughts, impulsive behavior -eye pain -fast, irregular heartbeat -feeling faint or lightheaded, falls -feeling agitated, angry, or irritable -hallucination, loss of contact with reality -loss of balance or coordination -loss of memory -painful or prolonged erections -restlessness, pacing, inability to keep still -seizures -stiff muscles -suicidal thoughts or other mood changes -trouble sleeping -unusual bleeding or bruising -unusually weak or tired -vomiting Side effects that usually do not require medical attention (report to your doctor or health care professional if they continue or are bothersome): -changes in appetite -change in sex drive or performance -headache -increased sweating -indigestion, nausea -tremors This list may not describe all possible side effects. Call your doctor for medical advice about side effects. You may report side effects to FDA at 1-800-FDA-1088. Where should I keep my medicine? Keep out of reach of children. Store at room temperature between 15 and 30 degrees C (59 and 86 degrees F). Throw away any unused medicine after the expiration date. NOTE: This sheet is a summary. It may not cover all possible information. If you have questions about this medicine, talk to your doctor, pharmacist, or health care provider.  2018 Elsevier/Gold Standard (2015-11-06 13:20:23)    Trazodone tablets What is this medicine? TRAZODONE (TRAZ oh done) is used to treat depression. This medicine may be used for other purposes; ask your health care provider or pharmacist if you have questions. COMMON BRAND NAME(S): Desyrel What should I tell my health care provider before I take this medicine? They need to know if you have any of these conditions: -attempted suicide or thinking about it -bipolar disorder -bleeding problems -glaucoma -heart disease, or previous heart  attack -  irregular heart beat -kidney or liver disease -low levels of sodium in the blood -an unusual or allergic reaction to trazodone, other medicines, foods, dyes or preservatives -pregnant or trying to get pregnant -breast-feeding How should I use this medicine? Take this medicine by mouth with a glass of water. Follow the directions on the prescription label. Take this medicine shortly after a meal or a light snack. Take your medicine at regular intervals. Do not take your medicine more often than directed. Do not stop taking this medicine suddenly except upon the advice of your doctor. Stopping this medicine too quickly may cause serious side effects or your condition may worsen. A special MedGuide will be given to you by the pharmacist with each prescription and refill. Be sure to read this information carefully each time. Talk to your pediatrician regarding the use of this medicine in children. Special care may be needed. Overdosage: If you think you have taken too much of this medicine contact a poison control center or emergency room at once. NOTE: This medicine is only for you. Do not share this medicine with others. What if I miss a dose? If you miss a dose, take it as soon as you can. If it is almost time for your next dose, take only that dose. Do not take double or extra doses. What may interact with this medicine? Do not take this medicine with any of the following medications: -certain medicines for fungal infections like fluconazole, itraconazole, ketoconazole, posaconazole, voriconazole -cisapride -dofetilide -dronedarone -linezolid -MAOIs like Carbex, Eldepryl, Marplan, Nardil, and Parnate -mesoridazine -methylene blue (injected into a vein) -pimozide -saquinavir -thioridazine -ziprasidone This medicine may also interact with the following medications: -alcohol -antiviral medicines for HIV or AIDS -aspirin and aspirin-like medicines -barbiturates like  phenobarbital -certain medicines for blood pressure, heart disease, irregular heart beat -certain medicines for depression, anxiety, or psychotic disturbances -certain medicines for migraine headache like almotriptan, eletriptan, frovatriptan, naratriptan, rizatriptan, sumatriptan, zolmitriptan -certain medicines for seizures like carbamazepine and phenytoin -certain medicines for sleep -certain medicines that treat or prevent blood clots like dalteparin, enoxaparin, warfarin -digoxin -fentanyl -lithium -NSAIDS, medicines for pain and inflammation, like ibuprofen or naproxen -other medicines that prolong the QT interval (cause an abnormal heart rhythm) -rasagiline -supplements like St. John's wort, kava kava, valerian -tramadol -tryptophan This list may not describe all possible interactions. Give your health care provider a list of all the medicines, herbs, non-prescription drugs, or dietary supplements you use. Also tell them if you smoke, drink alcohol, or use illegal drugs. Some items may interact with your medicine. What should I watch for while using this medicine? Tell your doctor if your symptoms do not get better or if they get worse. Visit your doctor or health care professional for regular checks on your progress. Because it may take several weeks to see the full effects of this medicine, it is important to continue your treatment as prescribed by your doctor. Patients and their families should watch out for new or worsening thoughts of suicide or depression. Also watch out for sudden changes in feelings such as feeling anxious, agitated, panicky, irritable, hostile, aggressive, impulsive, severely restless, overly excited and hyperactive, or not being able to sleep. If this happens, especially at the beginning of treatment or after a change in dose, call your health care professional. Bonita QuinYou may get drowsy or dizzy. Do not drive, use machinery, or do anything that needs mental alertness  until you know how this medicine affects you. Do not stand or  sit up quickly, especially if you are an older patient. This reduces the risk of dizzy or fainting spells. Alcohol may interfere with the effect of this medicine. Avoid alcoholic drinks. This medicine may cause dry eyes and blurred vision. If you wear contact lenses you may feel some discomfort. Lubricating drops may help. See your eye doctor if the problem does not go away or is severe. Your mouth may get dry. Chewing sugarless gum, sucking hard candy and drinking plenty of water may help. Contact your doctor if the problem does not go away or is severe. What side effects may I notice from receiving this medicine? Side effects that you should report to your doctor or health care professional as soon as possible: -allergic reactions like skin rash, itching or hives, swelling of the face, lips, or tongue -elevated mood, decreased need for sleep, racing thoughts, impulsive behavior -confusion -fast, irregular heartbeat -feeling faint or lightheaded, falls -feeling agitated, angry, or irritable -loss of balance or coordination -painful or prolonged erections -restlessness, pacing, inability to keep still -suicidal thoughts or other mood changes -tremors -trouble sleeping -seizures -unusual bleeding or bruising Side effects that usually do not require medical attention (report to your doctor or health care professional if they continue or are bothersome): -change in sex drive or performance -change in appetite or weight -constipation -headache -muscle aches or pains -nausea This list may not describe all possible side effects. Call your doctor for medical advice about side effects. You may report side effects to FDA at 1-800-FDA-1088. Where should I keep my medicine? Keep out of the reach of children. Store at room temperature between 15 and 30 degrees C (59 to 86 degrees F). Protect from light. Keep container tightly closed.  Throw away any unused medicine after the expiration date. NOTE: This sheet is a summary. It may not cover all possible information. If you have questions about this medicine, talk to your doctor, pharmacist, or health care provider.  2018 Elsevier/Gold Standard (2015-11-02 16:57:05)     IF you received an x-ray today, you will receive an invoice from [facility]. Please contact [facility] at [phone] with questions or concerns regarding your invoice.   IF you received labwork today, you will receive an invoice from [facility]. Please contact [facility] at [phone] with questions or concerns regarding your invoice.   Our billing staff will not be able to assist you with questions regarding bills from these companies.  You will be contacted with the lab results as soon as they are available. The fastest way to get your results is to activate your My Chart account. Instructions are located on the last page of this paperwork. If you have not heard from [place] regarding the results in 2 weeks, please contact this office.

## 2018-03-17 ENCOUNTER — Other Ambulatory Visit: Payer: Self-pay | Admitting: Urgent Care

## 2018-03-17 NOTE — Telephone Encounter (Signed)
Patient requesting Trazodone 50 mg refill. Please advise, thank you.

## 2018-03-17 NOTE — Telephone Encounter (Signed)
Attempted to call pt. To ask if he will follow up with another provider. Unable to leave a voicemail.

## 2018-11-13 ENCOUNTER — Encounter: Payer: Self-pay | Admitting: Family Medicine

## 2018-11-13 ENCOUNTER — Telehealth (INDEPENDENT_AMBULATORY_CARE_PROVIDER_SITE_OTHER): Payer: BLUE CROSS/BLUE SHIELD | Admitting: Family Medicine

## 2018-11-13 ENCOUNTER — Other Ambulatory Visit: Payer: Self-pay

## 2018-11-13 VITALS — BP 132/70 | Ht 72.0 in | Wt 190.0 lb

## 2018-11-13 DIAGNOSIS — R0981 Nasal congestion: Secondary | ICD-10-CM | POA: Diagnosis not present

## 2018-11-13 DIAGNOSIS — R519 Headache, unspecified: Secondary | ICD-10-CM

## 2018-11-13 DIAGNOSIS — J309 Allergic rhinitis, unspecified: Secondary | ICD-10-CM

## 2018-11-13 DIAGNOSIS — R51 Headache: Secondary | ICD-10-CM

## 2018-11-13 NOTE — Progress Notes (Signed)
Virtual Visit via Telephone Note  I connected with [name] on 11/13/18 at 10:35 AM by telephone and verified that I am speaking with the correct person using two identifiers.   I discussed the limitations, risks, security and privacy concerns of performing an evaluation and management service by telephone and the availability of in person appointments. I also discussed with the patient that there may be a patient responsible charge related to this service. The patient expressed understanding and agreed to proceed, consent obtained.   Chief complaint:  Fatigue, shock sensation in head, nasal congestion.   History of Present Illness: Wayne Ballard is a 33 y.o. male  Reports approx 2 week history of feeling sluggish, some sinus congestion, runny nose. Though had sinus infection. No fever. No change in taste/smell. No known specific exposure to Covid 19.  Some headache - "brain zap" like electricity going through head. Those symptoms have improved. Less HA, less fatigue. No current meds.  Not on Lexapro.  Not on allergy meds currently.   Congestion, sluggishness has improved.  Feels 100% to return to work. Back to normal for past 5 days. Out of work Tues-Fri last week, but able to RTW.   Tx: aspirin, otc cold meds.     Patient Active Problem List   Diagnosis Date Noted  . NSTEMI (non-ST elevated myocardial infarction) (HCC) 02/14/2017  . Myopericarditis 02/14/2017   Past Medical History:  Diagnosis Date  . ADHD 1995   Past Surgical History:  Procedure Laterality Date  . LEFT HEART CATH AND CORONARY ANGIOGRAPHY N/A 03/11/2017   Procedure: LEFT HEART CATH AND CORONARY ANGIOGRAPHY;  Surgeon: Yates Decamp, MD;  Location: [facility];  Service: Cardiovascular;  Laterality: N/A;   No Known Allergies Prior to Admission medications   Medication Sig Start Date End Date Taking? Authorizing Provider  escitalopram (LEXAPRO) 10 MG tablet Take 1 tablet (10 mg total) by mouth  daily. Patient not taking: Reported on 11/13/2018 01/28/18   Wallis Bamberg, PA-C  fluticasone [facility]) 50 MCG/ACT nasal spray Place 2 sprays into both nostrils daily. Patient not taking: Reported on 11/13/2018 01/28/18   Wallis Bamberg, PA-C  hydrOXYzine (ATARAX/VISTARIL) 25 MG tablet Take 1 tablet (25 mg total) by mouth every 6 (six) hours as needed for anxiety. Patient not taking: Reported on 01/28/2018 12/04/17   Army Melia A, PA-C  levocetirizine (XYZAL) 5 MG tablet Take 1 tablet (5 mg total) by mouth every evening. Patient not taking: Reported on 11/13/2018 01/28/18   Wallis Bamberg, PA-C  montelukast (SINGULAIR) 10 MG tablet Take 1 tablet (10 mg total) by mouth at bedtime. Patient not taking: Reported on 11/13/2018 01/28/18   Wallis Bamberg, PA-C  traZODone (DESYREL) 50 MG tablet TAKE 1-2 TABLETS (50-100 MG TOTAL) BY MOUTH AT BEDTIME AS NEEDED FOR SLEEP. Patient not taking: Reported on 11/13/2018 03/22/18   Doristine Bosworth, MD   Social History   Socioeconomic History  . Marital status: Single    Spouse name: Not on file  . Number of children: Not on file  . Years of education: Not on file  . Highest education level: Not on file  Occupational History  . Not on file  Social Needs  . Financial resource strain: Not on file  . Food insecurity:    Worry: Not on file    Inability: Not on file  . Transportation needs:    Medical: Not on file    Non-medical: Not on file  Tobacco Use  .  Smoking status: Current Every Day Smoker    Packs/day: 1.00    Years: 8.00    Pack years: 8.00    Types: Cigarettes  . Smokeless tobacco: Never Used  Substance and Sexual Activity  . Alcohol use: Yes    Comment: Social  . Drug use: Yes    Types: Marijuana    Comment: Used heroin approx 5 months ago, distant cocaine use (years), no methamphetamine  . Sexual activity: Not on file  Lifestyle  . Physical activity:    Days per week: Not on file    Minutes per session: Not on file  . Stress: Not on file   Relationships  . Social connections:    Talks on phone: Not on file    Gets together: Not on file    Attends religious service: Not on file    Active member of club or organization: Not on file    Attends meetings of clubs or organizations: Not on file    Relationship status: Not on file  . Intimate partner violence:    Fear of current or ex partner: Not on file    Emotionally abused: Not on file    Physically abused: Not on file    Forced sexual activity: Not on file  Other Topics Concern  . Not on file  Social History Narrative  . Not on file     Observations/Objective: No distress on phone, normal speech. Asymptomatic at this time.   Assessment and Plan: Sinus congestion  Allergic rhinitis, unspecified seasonality, unspecified trigger  Nonintractable episodic headache, unspecified headache type  Sinus headache, nasal congestion, rhinorrhea last week without fever myalgias or other concerning symptoms.  No known exposure COVID-19, less likely infectious.  He is now back to normal without symptoms in the past 5 days.  Has been able to return to work without issue.  Possible previous allergic rhinitis as he has been off of his nasal spray, antihistamine, Singulair.  -Okay to return to work as asymptomatic, no fever, note to be provided for last week.  -Option of Flonase, Xyzal over-the-counter for allergy symptoms with RTC precautions   Follow Up Instructions:   as needed.  I discussed the assessment and treatment plan with the patient. The patient was provided an opportunity to ask questions and all were answered. The patient agreed with the plan and demonstrated an understanding of the instructions.   The patient was advised to call back or seek an in-person evaluation if the symptoms worsen or if the condition fails to improve as anticipated.  I provided 11 minutes of non-face-to-face time during this encounter.  Signed,   Meredith StaggersJeffrey Olander Friedl, MD [facility]  [facility].  11/13/18

## 2018-11-13 NOTE — Progress Notes (Signed)
Pt c/o feeling shocks in head. Also having some congestion and feeling lightheaded and no energy. This has been going on for a couple of weeks.

## 2019-01-10 IMAGING — CR DG CHEST 2V
2 series · 2 of 2 positions shown · non-contrast
Comparison: 07/07/2017

CLINICAL DATA: Anxiety attack at work accompanied by shortness of
breath and chest pain. Heart attack in January 2017. current smoker.

EXAM:
CHEST - 2 VIEW

[w chest pa]
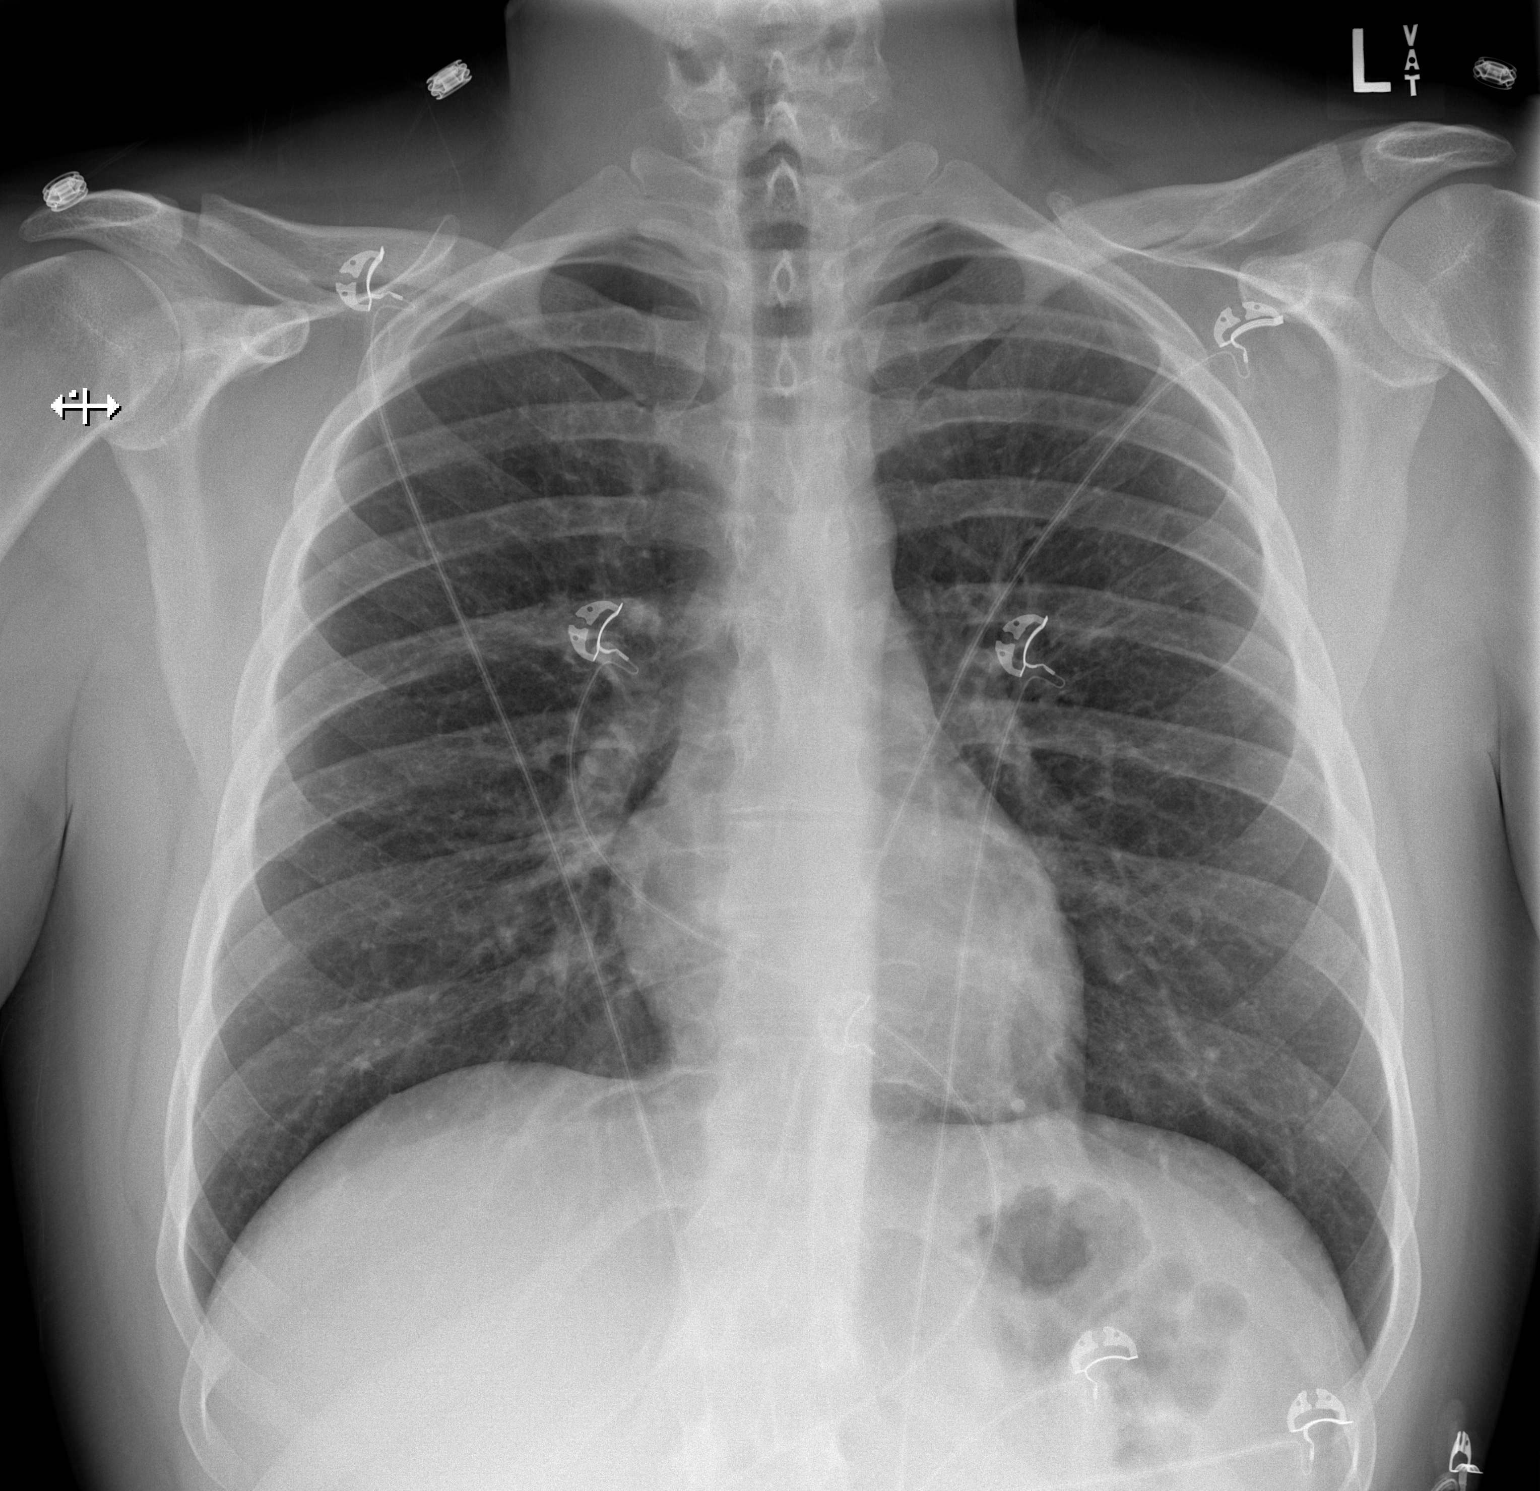

[w chest lat]
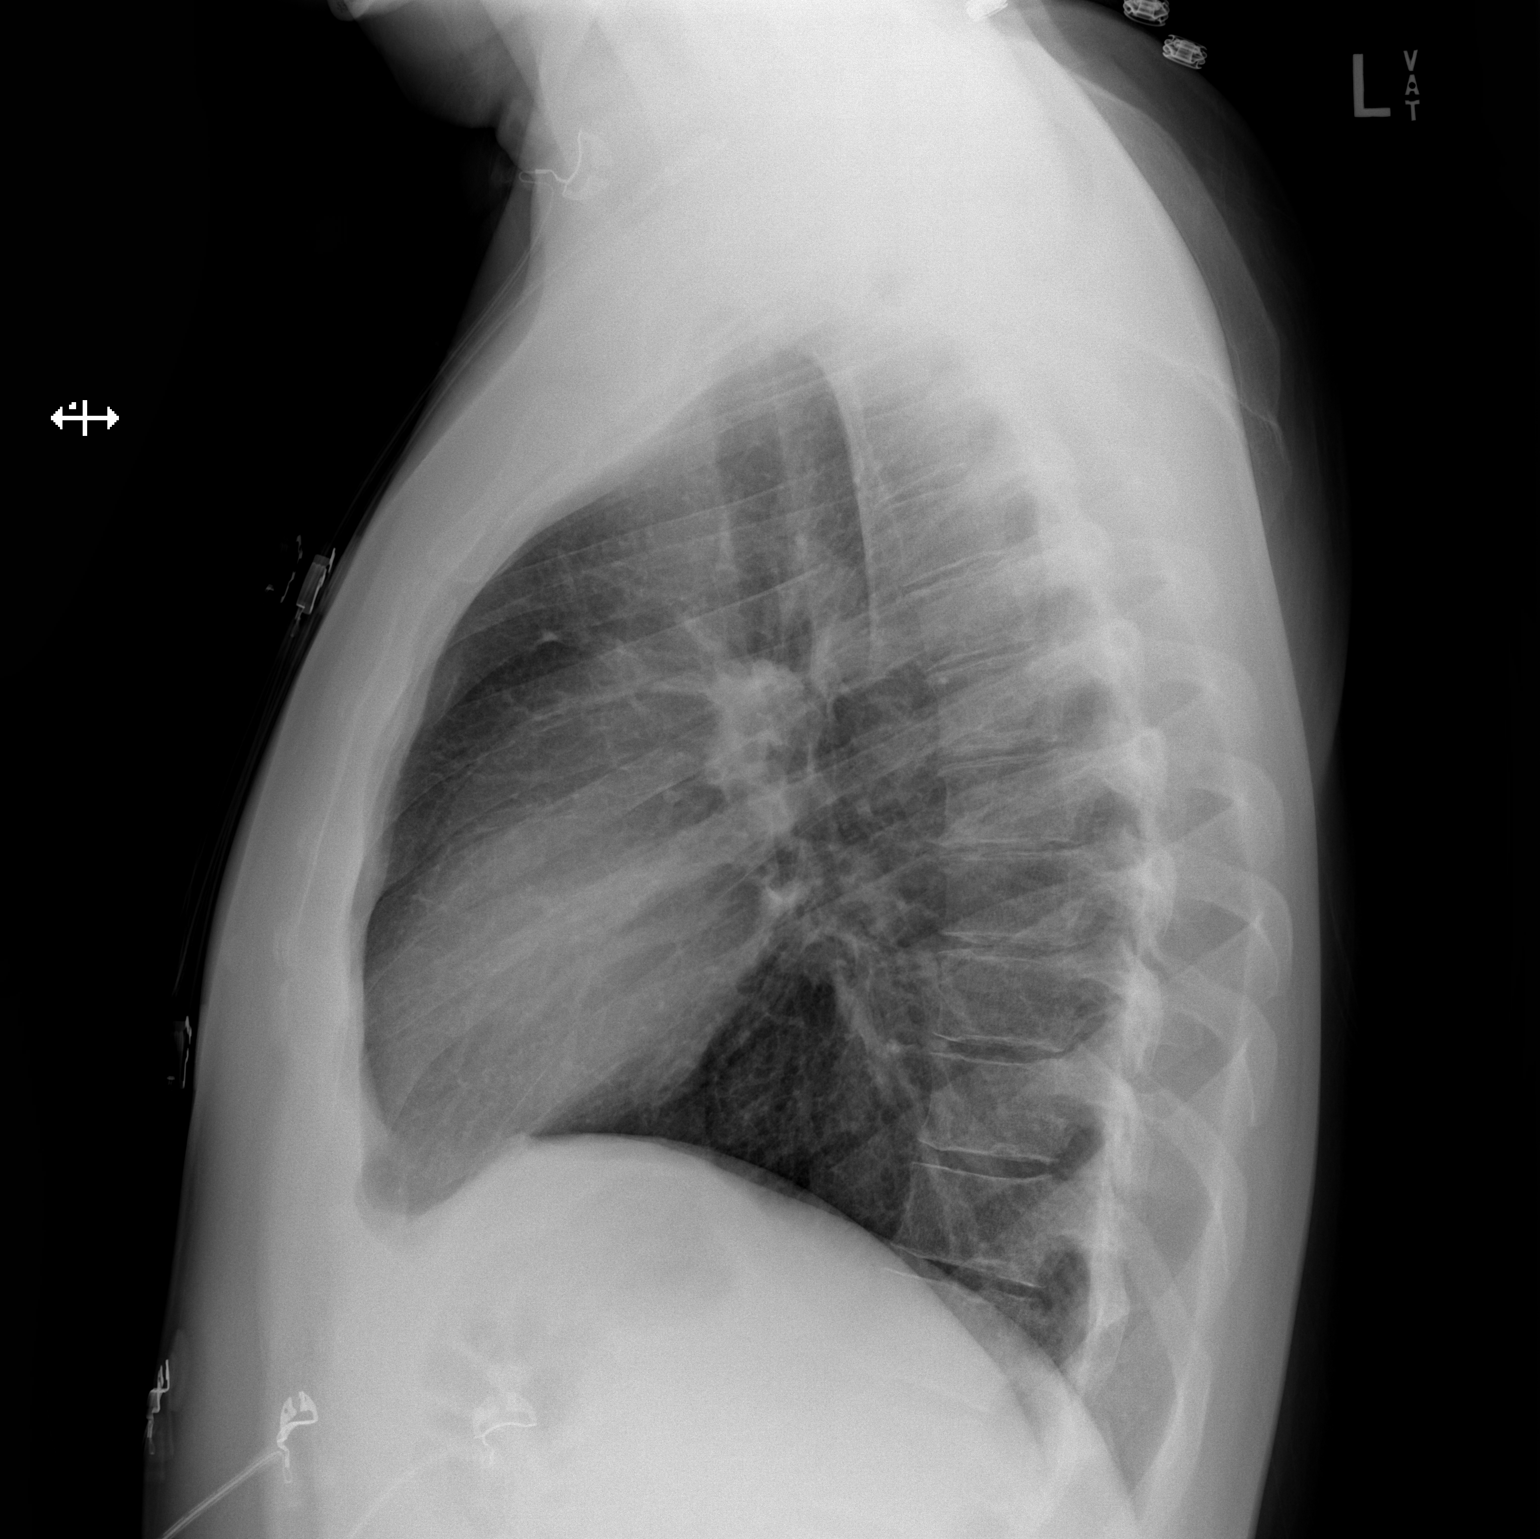

[2 of 2 positions shown; findings below may reference images not displayed]

FINDINGS: The heart size and mediastinal contours are within normal limits.
Both lungs are clear. The visualized skeletal structures are
unremarkable.
IMPRESSION: No active cardiopulmonary disease.

## 2023-12-22 ENCOUNTER — Emergency Department (HOSPITAL_COMMUNITY): Payer: Self-pay

## 2023-12-22 ENCOUNTER — Other Ambulatory Visit: Payer: Self-pay

## 2023-12-22 ENCOUNTER — Emergency Department (HOSPITAL_COMMUNITY)
Admission: EM | Admit: 2023-12-22 | Discharge: 2023-12-22 | Disposition: A | Discharge status: Home / Self Care | Payer: Self-pay | Attending: Emergency Medicine | Admitting: Emergency Medicine

## 2023-12-22 DIAGNOSIS — D72829 Elevated white blood cell count, unspecified: Secondary | ICD-10-CM | POA: Insufficient documentation

## 2023-12-22 DIAGNOSIS — R0789 Other chest pain: Secondary | ICD-10-CM

## 2023-12-22 DIAGNOSIS — K2970 Gastritis, unspecified, without bleeding: Secondary | ICD-10-CM | POA: Insufficient documentation

## 2023-12-22 LAB — BASIC METABOLIC PANEL WITH GFR
Anion gap: 13 (ref 5–15)
BUN: 16 mg/dL (ref 6–20)
CO2: 20 mmol/L — ABNORMAL LOW (ref 22–32)
Calcium: 9.5 mg/dL (ref 8.9–10.3)
Chloride: 104 mmol/L (ref 98–111)
Creatinine, Ser: 0.92 mg/dL (ref 0.61–1.24)
GFR, Estimated: >60 mL/min (ref >60)
Glucose, Bld: 138 mg/dL — ABNORMAL HIGH (ref 70–99)
Potassium: 3.9 mmol/L (ref 3.5–5.1)
Sodium: 137 mmol/L (ref 135–145)

## 2023-12-22 LAB — CBC
HCT: 57.3 % — ABNORMAL HIGH (ref 39.0–52.0)
Hemoglobin: 19.9 g/dL — ABNORMAL HIGH (ref 13.0–17.0)
MCH: 29.6 pg (ref 26.0–34.0)
MCHC: 34.7 g/dL (ref 30.0–36.0)
MCV: 85.3 fL (ref 80.0–100.0)
Platelets: 275 K/uL (ref 150–400)
RBC: 6.72 MIL/uL — ABNORMAL HIGH (ref 4.22–5.81)
RDW: 13.5 % (ref 11.5–15.5)
WBC: 14.6 K/uL — ABNORMAL HIGH (ref 4.0–10.5)
nRBC: 0 % (ref 0.0–0.2)

## 2023-12-22 LAB — DIFFERENTIAL
Abs Immature Granulocytes: 0.06 K/uL (ref 0.00–0.07)
Basophils Absolute: 0 K/uL (ref 0.0–0.1)
Basophils Relative: 0 %
Eosinophils Absolute: 0.1 K/uL (ref 0.0–0.5)
Eosinophils Relative: 1 %
Immature Granulocytes: 0 %
Lymphocytes Relative: 22 %
Lymphs Abs: 3.2 K/uL (ref 0.7–4.0)
Monocytes Absolute: 1.2 K/uL — ABNORMAL HIGH (ref 0.1–1.0)
Monocytes Relative: 8 %
Neutro Abs: 9.9 K/uL — ABNORMAL HIGH (ref 1.7–7.7)
Neutrophils Relative %: 69 %

## 2023-12-22 LAB — URINALYSIS, ROUTINE W REFLEX MICROSCOPIC
Bilirubin Urine: NEGATIVE
Glucose, UA: NEGATIVE mg/dL
Hgb urine dipstick: NEGATIVE
Ketones, ur: NEGATIVE mg/dL
Leukocytes,Ua: NEGATIVE
Nitrite: NEGATIVE
Protein, ur: NEGATIVE mg/dL
Specific Gravity, Urine: 1.02 (ref 1.005–1.030)
pH: 5 (ref 5.0–8.0)

## 2023-12-22 LAB — TROPONIN I (HIGH SENSITIVITY): Troponin I (High Sensitivity): 3 ng/L (ref <18)

## 2023-12-22 MED ORDER — ONDANSETRON 4 MG PO TBDP: 4.0000 mg | ORAL_TABLET | Freq: Three times a day (TID) | ORAL | 0 refills | Status: AC | PRN

## 2023-12-22 MED ORDER — PANTOPRAZOLE SODIUM 40 MG PO TBEC
40.0000 mg | DELAYED_RELEASE_TABLET | Freq: Once | ORAL | Status: AC
  Administered 2023-12-22: 40 mg via ORAL
  Filled 2023-12-22: qty 1

## 2023-12-22 MED ORDER — PANTOPRAZOLE SODIUM 20 MG PO TBEC: 40.0000 mg | DELAYED_RELEASE_TABLET | Freq: Every day | ORAL | 0 refills | Status: AC

## 2023-12-22 MED ORDER — ALUM & MAG HYDROXIDE-SIMETH 200-200-20 MG/5ML PO SUSP
30.0000 mL | Freq: Once | ORAL | Status: AC
  Administered 2023-12-22: 30 mL via ORAL
  Filled 2023-12-22: qty 30

## 2023-12-22 MED ORDER — ONDANSETRON 4 MG PO TBDP: 4.0000 mg | ORAL_TABLET | Freq: Three times a day (TID) | ORAL | 0 refills | Status: DC | PRN

## 2023-12-22 MED ORDER — MAALOX MAX 400-400-40 MG/5ML PO SUSP: 10.0000 mL | Freq: Four times a day (QID) | ORAL | 0 refills | Status: AC | PRN

## 2023-12-22 NOTE — ED Triage Notes (Signed)
 Patient to ED by POV with c/o chest pain. Per patient pain started today he states its indigestion that is going upward.

## 2023-12-22 NOTE — Discharge Instructions (Addendum)
 Your workup today was reassuring.  No concern for heart attack at this time.  You likely have gastritis.  Protonix  sent into the pharmacy.  Take this medicine daily.  I have also sent in Maalox which you can take as needed.  Return for any emergent symptoms.  Follow-up with your primary care doctor.

## 2023-12-22 NOTE — ED Provider Notes (Cosign Needed Addendum)
 [place] EMERGENCY DEPARTMENT AT [facility] Provider Note   CSN: [id number] Arrival date & time: 12/22/23  1214     Patient presents with: Chest Pain   Wayne Ballard is a 38 y.o. male.   38 year old male presents today for concern of chest pain.  History of myopericarditis.  This feels different.  Symptoms ongoing since Friday morning.  Feels different from his previous episode of myopericarditis in 2018.  Endorses history of acid reflux but does not take any PPI.  Has been relying on Tums.  The history is provided by the patient. No language interpreter was used.       Prior to Admission medications   Medication Sig Start Date End Date Taking? Authorizing Provider  alum & mag hydroxide-simeth (MAALOX MAX) 400-400-40 MG/5ML suspension Take 10 mLs by mouth every 6 (six) hours as needed for indigestion. 12/22/23  Yes Arisbel Maione, PA-C  ondansetron  (ZOFRAN -ODT) 4 MG disintegrating tablet Take 1 tablet (4 mg total) by mouth every 8 (eight) hours as needed. 12/22/23  Yes Torie Priebe, PA-C  pantoprazole  (PROTONIX ) 20 MG tablet Take 2 tablets (40 mg total) by mouth daily. 12/22/23 01/21/24 Yes Hildegard, Ren Grasse, PA-C    Allergies: Patient has no known allergies.    Review of Systems  Constitutional:  Negative for chills and fever.  Cardiovascular:  Positive for chest pain.  Gastrointestinal:  Positive for abdominal pain, nausea and vomiting.  All other systems reviewed and are negative.   Updated Vital Signs BP (!) 157/104   Pulse 99   Temp 98.7 F (37.1 C) (Oral)   Resp 19   Ht 6' (1.829 m)   Wt 113.4 kg   SpO2 96%   BMI 33.91 kg/m   Physical Exam Vitals and nursing note reviewed.  Constitutional:      General: He is not in acute distress.    Appearance: Normal appearance. He is not ill-appearing.  HENT:     Head: Normocephalic and atraumatic.     Nose: Nose normal.  Eyes:     Conjunctiva/sclera: Conjunctivae normal.  Cardiovascular:     Rate and Rhythm: Normal  rate and regular rhythm.  Pulmonary:     Effort: Pulmonary effort is normal. No respiratory distress.  Abdominal:     General: There is no distension.     Palpations: Abdomen is soft.     Tenderness: There is abdominal tenderness (Mild upper abdominal tenderness).  Musculoskeletal:        General: No deformity. Normal range of motion.     Cervical back: Normal range of motion.  Skin:    Findings: No rash.  Neurological:     Mental Status: He is alert.     (all labs ordered are listed, but only abnormal results are displayed) Labs Reviewed  BASIC METABOLIC PANEL WITH GFR - Abnormal; Notable for the following components:      Result Value   CO2 20 (*)    Glucose, Bld 138 (*)    All other components within normal limits  CBC - Abnormal; Notable for the following components:   WBC 14.6 (*)    RBC 6.72 (*)    Hemoglobin 19.9 (*)    HCT 57.3 (*)    All other components within normal limits  DIFFERENTIAL - Abnormal; Notable for the following components:   Neutro Abs 9.9 (*)    Monocytes Absolute 1.2 (*)    All other components within normal limits  URINALYSIS, ROUTINE W REFLEX MICROSCOPIC  TROPONIN  I (HIGH SENSITIVITY)    EKG: EKG Interpretation Date/Time:  Monday December 22 2023 12:26:49 EDT Ventricular Rate:  104 PR Interval:  124 QRS Duration:  78 QT Interval:  330 QTC Calculation: 434 R Axis:   26  Text Interpretation: Sinus tachycardia Borderline low voltage, extremity leads Baseline wander in lead(s) II V5 Confirmed by Yolande Charleston [id number]) on 12/22/2023 2:05:28 PM  Radiology: DG Chest 2 View Result Date: 12/22/2023 CLINICAL DATA:  Chest pain. EXAM: CHEST - 2 VIEW COMPARISON:  03/10/2019. FINDINGS: The heart size and mediastinal contours are within normal limits. No focal consolidation, pleural effusion, or pneumothorax. No acute osseous abnormality. IMPRESSION: No acute cardiopulmonary findings. Electronically Signed   By: Harrietta Sherry M.D.   On: 12/22/2023 13:01      Procedures   Medications Ordered in the ED  pantoprazole  (PROTONIX ) EC tablet 40 mg (40 mg Oral Given 12/22/23 1404)  alum & mag hydroxide-simeth (MAALOX/MYLANTA) 200-200-20 MG/5ML suspension 30 mL (30 mLs Oral Given 12/22/23 1404)                                    Medical Decision Making Amount and/or Complexity of Data Reviewed Labs: ordered. Radiology: ordered.  Risk OTC drugs. Prescription drug management.   Medical Decision Making / ED Course   This patient presents to the ED for concern of chest discomfort, reflux, this involves an extensive number of treatment options, and is a complaint that carries with it a high risk of complications and morbidity.  The differential diagnosis includes gastritis, gastroenteritis, acid reflux, ACS, PE, pancreatitis Low suspicion for PE given he is without shortness of breath, hypoxia.    MDM: 38 year old male presents today for concern of chest pain.  History of myopericarditis.  This feels different.  Symptoms ongoing since Friday morning.  Feels different from his previous episode of myopericarditis in 2018.  Endorses history of acid reflux but does not take any PPI.  Has been relying on Tums.  CBC with leukocytosis likely reactive.  Endorses dry heaving last night.  BMP without acute concern.  CBC with hemoglobin of 19 likely dehydration.  UA without evidence of UTI.  Initial troponin negative.  Will not repeat given duration of symptoms and low heart score of illness. EKG without acute ischemic change.  Chest x-ray without acute cardiopulmonary process.  Evaluated by attending.  Does not feel he needs second troponin. Will discharge with Protonix  and Maalox and Zofran .  Discussed follow-up.   Additional history obtained: -Additional history obtained from chart review chest x-ray -External records from outside source obtained and reviewed including: Chart review including previous notes, labs, imaging, consultation  notes   Lab Tests: -I ordered, reviewed, and interpreted labs.   The pertinent results include:   Labs Reviewed  BASIC METABOLIC PANEL WITH GFR - Abnormal; Notable for the following components:      Result Value   CO2 20 (*)    Glucose, Bld 138 (*)    All other components within normal limits  CBC - Abnormal; Notable for the following components:   WBC 14.6 (*)    RBC 6.72 (*)    Hemoglobin 19.9 (*)    HCT 57.3 (*)    All other components within normal limits  DIFFERENTIAL - Abnormal; Notable for the following components:   Neutro Abs 9.9 (*)    Monocytes Absolute 1.2 (*)    All other components within normal  limits  URINALYSIS, ROUTINE W REFLEX MICROSCOPIC  TROPONIN I (HIGH SENSITIVITY)      EKG  EKG Interpretation Date/Time:  Monday December 22 2023 12:26:49 EDT Ventricular Rate:  104 PR Interval:  124 QRS Duration:  78 QT Interval:  330 QTC Calculation: 434 R Axis:   26  Text Interpretation: Sinus tachycardia Borderline low voltage, extremity leads Baseline wander in lead(s) II V5 Confirmed by Yolande Charleston [phone]) on 12/22/2023 2:05:28 PM         Imaging Studies ordered: I ordered imaging studies including cxr I independently visualized and interpreted imaging. I agree with the radiologist interpretation   Medicines ordered and prescription drug management: Meds ordered this encounter  Medications   pantoprazole  (PROTONIX ) EC tablet 40 mg   alum & mag hydroxide-simeth (MAALOX/MYLANTA) 200-200-20 MG/5ML suspension 30 mL   DISCONTD: ondansetron  (ZOFRAN -ODT) 4 MG disintegrating tablet    Sig: Take 1 tablet (4 mg total) by mouth every 8 (eight) hours as needed for nausea or vomiting.    Dispense:  12 tablet    Refill:  0   pantoprazole  (PROTONIX ) 20 MG tablet    Sig: Take 2 tablets (40 mg total) by mouth daily.    Dispense:  60 tablet    Refill:  0    Supervising Provider:   MILLER, BRIAN [[id number]]   alum & mag hydroxide-simeth (MAALOX MAX) 400-400-40 MG/5ML  suspension    Sig: Take 10 mLs by mouth every 6 (six) hours as needed for indigestion.    Dispense:  355 mL    Refill:  0    Supervising Provider:   CLEOTILDE, BRIAN [[id number]]   ondansetron  (ZOFRAN -ODT) 4 MG disintegrating tablet    Sig: Take 1 tablet (4 mg total) by mouth every 8 (eight) hours as needed.    Dispense:  20 tablet    Refill:  0    Supervising Provider:   CLEOTILDE, BRIAN [[id number]]    -I have reviewed the patients home medicines and have made adjustments as needed     Reevaluation: After the interventions noted above, I reevaluated the patient and found that they have :improved  Co morbidities that complicate the patient evaluation  Past Medical History:  Diagnosis Date   ADHD 1995      Dispostion: Discharged in stable condition.  Return precaution discussed.  Patient voices understanding and is in agreement with plan.    Final diagnoses:  Atypical chest pain  Gastritis without bleeding, unspecified chronicity, unspecified gastritis type    ED Discharge Orders          Ordered    ondansetron  (ZOFRAN -ODT) 4 MG disintegrating tablet  Every 8 hours PRN,   Status:  Discontinued        12/22/23 1436    pantoprazole  (PROTONIX ) 20 MG tablet  Daily        12/22/23 1447    alum & mag hydroxide-simeth (MAALOX MAX) 400-400-40 MG/5ML suspension  Every 6 hours PRN        12/22/23 1447    ondansetron  (ZOFRAN -ODT) 4 MG disintegrating tablet  Every 8 hours PRN        12/22/23 1447               Hildegard Loge, PA-C 12/22/23 1451    [address] PA-C 12/22/23 1505    Yolande Charleston BROCKS, MD 12/24/23 1125

## 2024-03-14 ENCOUNTER — Other Ambulatory Visit: Payer: Self-pay

## 2024-03-14 ENCOUNTER — Emergency Department (HOSPITAL_COMMUNITY)
Admission: EM | Admit: 2024-03-14 | Discharge: 2024-03-14 | Disposition: A | Discharge status: Home / Self Care | Payer: Self-pay | Attending: Emergency Medicine | Admitting: Emergency Medicine

## 2024-03-14 ENCOUNTER — Encounter (HOSPITAL_COMMUNITY): Payer: Self-pay

## 2024-03-14 DIAGNOSIS — K029 Dental caries, unspecified: Secondary | ICD-10-CM | POA: Insufficient documentation

## 2024-03-14 DIAGNOSIS — K047 Periapical abscess without sinus: Secondary | ICD-10-CM | POA: Insufficient documentation

## 2024-03-14 MED ORDER — AMOXICILLIN 500 MG PO CAPS: 500.0000 mg | ORAL_CAPSULE | Freq: Three times a day (TID) | ORAL | 0 refills | Status: DC

## 2024-03-14 NOTE — ED Triage Notes (Signed)
 Patient c/o swelling at site of broken tooth, concerned it may develop into abscess and thought he may need antibiotics. Reports minimal pain at this time.

## 2024-03-14 NOTE — ED Provider Notes (Signed)
 [place] EMERGENCY DEPARTMENT AT [facility] Provider Note   CSN: [id number] Arrival date & time: 03/14/24  1607     Patient presents with: Broken Tooth   Wayne Ballard is a 38 y.o. male who presents secondary to left-sided maxillary facial edema.  Denies having any pain to the same area, denies any tenderness, has been developing over the last several days.  Does not have any follow-up with dentistry, has not taken any over-the-counter medications prior to arrival.   HPI     Prior to Admission medications   Medication Sig Start Date End Date Taking? Authorizing Provider  alum & mag hydroxide-simeth (MAALOX MAX) 400-400-40 MG/5ML suspension Take 10 mLs by mouth every 6 (six) hours as needed for indigestion. 12/22/23   Hildegard Loge, PA-C  amoxicillin (AMOXIL) 500 MG capsule Take 1 capsule (500 mg total) by mouth 3 (three) times daily. 03/14/24   Myriam Dorn BROCKS, PA  ondansetron  (ZOFRAN -ODT) 4 MG disintegrating tablet Take 1 tablet (4 mg total) by mouth every 8 (eight) hours as needed. 12/22/23   Hildegard, Amjad, PA-C  pantoprazole  (PROTONIX ) 20 MG tablet Take 2 tablets (40 mg total) by mouth daily. 12/22/23 01/21/24  Hildegard Loge, PA-C    Allergies: Patient has no known allergies.    Review of Systems  HENT:  Positive for dental problem and facial swelling.   All other systems reviewed and are negative.   Updated Vital Signs BP 130/84 (BP Location: Right Arm)   Pulse 99   Temp 98.4 F (36.9 C)   Resp 19   Ht 6' (1.829 m)   Wt 113.4 kg   SpO2 97%   BMI 33.91 kg/m   Physical Exam Vitals and nursing note reviewed.  Constitutional:      General: He is not in acute distress.    Appearance: Normal appearance.  HENT:     Head: Normocephalic and atraumatic.     Mouth/Throat:     Mouth: Mucous membranes are moist.     Dentition: Abnormal dentition. Dental caries present. No dental tenderness.     Pharynx: Oropharynx is clear.  Eyes:     Extraocular Movements:  Extraocular movements intact.     Conjunctiva/sclera: Conjunctivae normal.     Pupils: Pupils are equal, round, and reactive to light.  Cardiovascular:     Rate and Rhythm: Normal rate and regular rhythm.     Pulses: Normal pulses.     Heart sounds: Normal heart sounds. No murmur heard.    No friction rub. No gallop.  Pulmonary:     Effort: Pulmonary effort is normal.     Breath sounds: Normal breath sounds.  Abdominal:     General: Abdomen is flat. Bowel sounds are normal.     Palpations: Abdomen is soft.  Musculoskeletal:        General: Normal range of motion.     Cervical back: Normal range of motion and neck supple.     Right lower leg: No edema.     Left lower leg: No edema.  Skin:    General: Skin is warm and dry.     Capillary Refill: Capillary refill takes less than 2 seconds.  Neurological:     General: No focal deficit present.     Mental Status: He is alert. Mental status is at baseline.  Psychiatric:        Mood and Affect: Mood normal.     (all labs ordered are listed, but only abnormal results are  displayed) Labs Reviewed - No data to display  EKG: None  Radiology: No results found.   Procedures   Medications Ordered in the ED - No data to display                                  Medical Decision Making  After evaluating the patient and consistent with their history of present illness, patient likely has periodontitis, can also consider left-sided maxillary sinusitis.  In other regard, will begin treatment with outpatient course of amoxicillin with referral to be made to dentistry for further definitive management.  This was explained to the patient, he understands agrees has no further concerns at this time.  Outpatient course of amoxicillin sent into patient's pharmacy.     Final diagnoses:  Dental infection    ED Discharge Orders          Ordered    amoxicillin (AMOXIL) 500 MG capsule  3 times daily,   Status:  Discontinued         03/14/24 1630    amoxicillin (AMOXIL) 500 MG capsule  3 times daily        03/14/24 1630               Myriam Dorn BROCKS, [place] 03/14/24 1630    Melvenia Motto, MD 03/14/24 2035

## 2024-04-11 ENCOUNTER — Emergency Department (HOSPITAL_COMMUNITY)
Admission: EM | Admit: 2024-04-11 | Discharge: 2024-04-11 | Disposition: A | Discharge status: Home / Self Care | Payer: Self-pay | Attending: Emergency Medicine | Admitting: Emergency Medicine

## 2024-04-11 ENCOUNTER — Other Ambulatory Visit: Payer: Self-pay

## 2024-04-11 ENCOUNTER — Encounter (HOSPITAL_COMMUNITY): Payer: Self-pay | Admitting: Emergency Medicine

## 2024-04-11 DIAGNOSIS — K029 Dental caries, unspecified: Secondary | ICD-10-CM | POA: Insufficient documentation

## 2024-04-11 DIAGNOSIS — F17210 Nicotine dependence, cigarettes, uncomplicated: Secondary | ICD-10-CM | POA: Insufficient documentation

## 2024-04-11 MED ORDER — AMOXICILLIN 500 MG PO CAPS: 500.0000 mg | ORAL_CAPSULE | Freq: Three times a day (TID) | ORAL | 0 refills | Status: AC

## 2024-04-11 MED ORDER — IBUPROFEN 800 MG PO TABS
800.0000 mg | ORAL_TABLET | Freq: Once | ORAL | Status: AC
  Administered 2024-04-11: 800 mg via ORAL
  Filled 2024-04-11: qty 1

## 2024-04-11 NOTE — Discharge Instructions (Addendum)
 We evaluated you for your dental pain.  Your examination shows that you may have a dental infection.  We have prescribed you antibiotics.  Please take these as prescribed.  Please establish care with a dentist as you may need an extraction.  Return if you have worsening symptoms such as trouble swallowing, swelling to your face, fevers, or any other concerning symptoms.

## 2024-04-11 NOTE — ED Triage Notes (Signed)
 Pt reports left upper dental pain. Denies fevers.

## 2024-04-12 NOTE — ED Provider Notes (Signed)
 [place] EMERGENCY DEPARTMENT AT [facility] Provider Note  CSN: [id number] Arrival date & time: 04/11/24 1155  Chief Complaint(s) Dental Pain  HPI Wayne Ballard is a 38 y.o. male p/w tooth pain. Pt reports pain to left upper jaw. Present for few days. No facial swelling, fevers, trouble swallowing. Feels similar to prior dental infection   Past Medical History Past Medical History:  Diagnosis Date   ADHD 1995   Patient Active Problem List   Diagnosis Date Noted   NSTEMI (non-ST elevated myocardial infarction) (HCC) 02/14/2017   Myopericarditis 02/14/2017   Home Medication(s) Prior to Admission medications   Medication Sig Start Date End Date Taking? Authorizing Provider  alum & mag hydroxide-simeth (MAALOX MAX) 400-400-40 MG/5ML suspension Take 10 mLs by mouth every 6 (six) hours as needed for indigestion. 12/22/23   Hildegard Loge, PA-C  amoxicillin (AMOXIL) 500 MG capsule Take 1 capsule (500 mg total) by mouth 3 (three) times daily. 04/11/24   Francesca Elsie CROME, MD  ondansetron  (ZOFRAN -ODT) 4 MG disintegrating tablet Take 1 tablet (4 mg total) by mouth every 8 (eight) hours as needed. 12/22/23   Hildegard Loge, PA-C  pantoprazole  (PROTONIX ) 20 MG tablet Take 2 tablets (40 mg total) by mouth daily. 12/22/23 01/21/24  Hildegard Loge, PA-C                                                                                                                                    Past Surgical History Past Surgical History:  Procedure Laterality Date   LEFT HEART CATH AND CORONARY ANGIOGRAPHY N/A 03/11/2017   Procedure: LEFT HEART CATH AND CORONARY ANGIOGRAPHY;  Surgeon: Ladona Heinz, MD;  Location: [facility] INVASIVE CV [facility];  Service: Cardiovascular;  Laterality: N/A;   Family History History reviewed. No pertinent family history.  Social History Social History   Tobacco Use   Smoking status: Every Day    Current packs/day: 1.00    Average packs/day: 1 pack/day for 8.0 years (8.0 ttl pk-yrs)     Types: Cigarettes   Smokeless tobacco: Never  Substance Use Topics   Alcohol use: Yes    Comment: Social   Drug use: Yes    Types: Marijuana    Comment: Used heroin approx 5 months ago, distant cocaine use (years), no methamphetamine   Allergies Patient has no known allergies.  Review of Systems Review of Systems  All other systems reviewed and are negative.   Physical Exam Vital Signs  I have reviewed the triage vital signs BP (!) 160/99 (BP Location: Left Arm)   Pulse 85   Temp (!) 97.4 F (36.3 C) (Oral)   Resp 18   SpO2 97%  Physical Exam Vitals and nursing note reviewed.  Constitutional:      General: He is not in acute distress.    Appearance: Normal appearance.  HENT:     Head: Normocephalic and atraumatic.     Comments:  Left upper jaw 1st premolar with decay to gumline. No obvious periapical abscess. No external facial swelling or trouble swallowing    Mouth/Throat:     Mouth: Mucous membranes are moist.  Eyes:     Conjunctiva/sclera: Conjunctivae normal.  Cardiovascular:     Rate and Rhythm: Normal rate.  Pulmonary:     Effort: Pulmonary effort is normal. No respiratory distress.  Abdominal:     General: Abdomen is flat.  Skin:    General: Skin is warm and dry.     Capillary Refill: Capillary refill takes less than 2 seconds.  Neurological:     General: No focal deficit present.     Mental Status: He is alert. Mental status is at baseline.  Psychiatric:        Mood and Affect: Mood normal.        Behavior: Behavior normal.     ED Results and Treatments Labs (all labs ordered are listed, but only abnormal results are displayed) Labs Reviewed - No data to display                                                                                                                        Radiology No results found.  Pertinent labs & imaging results that were available during my care of the patient were reviewed by me and considered in my medical  decision making (see MDM for details).  Medications Ordered in ED Medications  ibuprofen  (ADVIL ) tablet 800 mg (800 mg Oral Given 04/11/24 1448)                                                                                                                                     Procedures Procedures  (including critical care time)  Medical Decision Making / ED Course   MDM:  38 y/o with dental pain.  Exam with decayed tooth w/o drainable abscess or facial swelling, e/o deep space infection. Will rx amoxicillin and recommended outpatient dental f/u. Discussed return precautions     Medicines ordered and prescription drug management: Meds ordered this encounter  Medications   amoxicillin (AMOXIL) 500 MG capsule    Sig: Take 1 capsule (500 mg total) by mouth 3 (three) times daily.    Dispense:  21 capsule    Refill:  0   ibuprofen  (ADVIL ) tablet 800 mg    -I have reviewed the patients home medicines and  have made adjustments as needed   Social Determinants of Health:  Diagnosis or treatment significantly limited by social determinants of health: obesity   Reevaluation: After the interventions noted above, I reevaluated the patient and found that their symptoms have improved  Co morbidities that complicate the patient evaluation  Past Medical History:  Diagnosis Date   ADHD 1995      Dispostion: Disposition decision including need for hospitalization was considered, and patient discharged from emergency department.    Final Clinical Impression(s) / ED Diagnoses Final diagnoses:  Pain due to dental caries     This chart was dictated using voice recognition software.  Despite best efforts to proofread,  errors can occur which can change the documentation meaning.    Francesca Elsie CROME, MD 04/12/24 [phone]
